# Patient Record
Sex: Female | Born: 1940 | Race: White | Hispanic: No | Smoking: Never smoker
Health system: Southern US, Community
[De-identification: ages and names within clinical notes are randomized; demographics above are authoritative.]

## PROBLEM LIST (undated history)

## (undated) DIAGNOSIS — M51369 Other intervertebral disc degeneration, lumbar region without mention of lumbar back pain or lower extremity pain: Secondary | ICD-10-CM

## (undated) DIAGNOSIS — K219 Gastro-esophageal reflux disease without esophagitis: Secondary | ICD-10-CM

## (undated) DIAGNOSIS — M5136 Other intervertebral disc degeneration, lumbar region: Secondary | ICD-10-CM

## (undated) DIAGNOSIS — H3530 Unspecified macular degeneration: Secondary | ICD-10-CM

## (undated) DIAGNOSIS — M810 Age-related osteoporosis without current pathological fracture: Secondary | ICD-10-CM

## (undated) DIAGNOSIS — M419 Scoliosis, unspecified: Secondary | ICD-10-CM

## (undated) DIAGNOSIS — A0472 Enterocolitis due to Clostridium difficile, not specified as recurrent: Secondary | ICD-10-CM

## (undated) HISTORY — DX: Gastro-esophageal reflux disease without esophagitis: K21.9

## (undated) HISTORY — PX: OTHER SURGICAL HISTORY: SHX169

## (undated) HISTORY — DX: Unspecified macular degeneration: H35.30

## (undated) HISTORY — DX: Scoliosis, unspecified: M41.9

## (undated) HISTORY — PX: SUPERFICIAL KERATECTOMY: SHX2459

## (undated) HISTORY — DX: Enterocolitis due to Clostridium difficile, not specified as recurrent: A04.72

## (undated) HISTORY — DX: Other intervertebral disc degeneration, lumbar region: M51.36

## (undated) HISTORY — DX: Other intervertebral disc degeneration, lumbar region without mention of lumbar back pain or lower extremity pain: M51.369

## (undated) HISTORY — PX: CATARACT EXTRACTION: SUR2

## (undated) HISTORY — PX: CERVICAL FUSION: SHX112

## (undated) HISTORY — DX: Age-related osteoporosis without current pathological fracture: M81.0

---

## 2001-01-21 ENCOUNTER — Ambulatory Visit (HOSPITAL_COMMUNITY): Admission: RE | Admit: 2001-01-21 | Discharge: 2001-01-21 | Payer: Self-pay | Admitting: Ophthalmology

## 2004-03-17 ENCOUNTER — Encounter: Admission: RE | Admit: 2004-03-17 | Discharge: 2004-03-17 | Payer: Self-pay | Admitting: Obstetrics and Gynecology

## 2004-04-05 ENCOUNTER — Encounter: Admission: RE | Admit: 2004-04-05 | Discharge: 2004-04-05 | Payer: Self-pay | Admitting: Obstetrics and Gynecology

## 2005-01-24 ENCOUNTER — Ambulatory Visit: Payer: Self-pay | Admitting: Internal Medicine

## 2005-02-01 ENCOUNTER — Ambulatory Visit: Payer: Self-pay | Admitting: Internal Medicine

## 2005-02-01 ENCOUNTER — Encounter: Payer: Self-pay | Admitting: Internal Medicine

## 2005-02-01 ENCOUNTER — Ambulatory Visit (HOSPITAL_COMMUNITY): Admission: RE | Admit: 2005-02-01 | Discharge: 2005-02-01 | Payer: Self-pay | Admitting: Internal Medicine

## 2005-03-09 ENCOUNTER — Ambulatory Visit: Payer: Self-pay | Admitting: Internal Medicine

## 2005-03-22 ENCOUNTER — Encounter: Admission: RE | Admit: 2005-03-22 | Discharge: 2005-03-22 | Payer: Self-pay | Admitting: Obstetrics and Gynecology

## 2006-03-27 ENCOUNTER — Encounter: Admission: RE | Admit: 2006-03-27 | Discharge: 2006-03-27 | Payer: Self-pay | Admitting: Internal Medicine

## 2007-04-01 ENCOUNTER — Ambulatory Visit (HOSPITAL_COMMUNITY): Admission: RE | Admit: 2007-04-01 | Discharge: 2007-04-01 | Payer: Self-pay | Admitting: Internal Medicine

## 2008-04-02 ENCOUNTER — Ambulatory Visit (HOSPITAL_COMMUNITY): Admission: RE | Admit: 2008-04-02 | Discharge: 2008-04-02 | Payer: Self-pay | Admitting: Internal Medicine

## 2009-04-04 ENCOUNTER — Ambulatory Visit (HOSPITAL_COMMUNITY): Admission: RE | Admit: 2009-04-04 | Discharge: 2009-04-04 | Payer: Self-pay | Admitting: Internal Medicine

## 2010-04-09 ENCOUNTER — Encounter: Payer: Self-pay | Admitting: *Deleted

## 2010-05-04 ENCOUNTER — Other Ambulatory Visit (HOSPITAL_COMMUNITY): Payer: Self-pay | Admitting: Internal Medicine

## 2010-05-04 DIAGNOSIS — Z139 Encounter for screening, unspecified: Secondary | ICD-10-CM

## 2010-05-11 ENCOUNTER — Ambulatory Visit (HOSPITAL_COMMUNITY)
Admission: RE | Admit: 2010-05-11 | Discharge: 2010-05-11 | Disposition: A | Discharge status: Home / Self Care | Payer: 59 | Source: Ambulatory Visit | Attending: Internal Medicine | Admitting: Internal Medicine

## 2010-05-11 DIAGNOSIS — Z1231 Encounter for screening mammogram for malignant neoplasm of breast: Secondary | ICD-10-CM | POA: Insufficient documentation

## 2010-05-11 DIAGNOSIS — Z139 Encounter for screening, unspecified: Secondary | ICD-10-CM

## 2010-08-04 NOTE — Op Note (Signed)
Sheila Oliver, Sheila Oliver               ACCOUNT NO.:  [id number]   MEDICAL RECORD NO.:  [id number]          PATIENT TYPE:  AMB   LOCATION:  DAY                           FACILITY:  [facility]   PHYSICIAN:  R. Roetta Sessions, M.D. DATE OF BIRTH:  1940-08-15   DATE OF PROCEDURE:  02/01/2005  DATE OF DISCHARGE:                                 OPERATIVE REPORT   PROCEDURE:  Esophagogastroduodenoscopy with Elease Hashimoto dilation followed by  esophageal biopsy.   INDICATIONS FOR PROCEDURE:  The patient is a 70 year old lady with a burning  mouth. She has some reflux symptoms ____________ esophageal dysphagia coming  for EGD. This approach has been discussed with the patient at length.  Potential risks, benefits, and alternatives have been reviewed. She had been  on Prevacid once daily and ran out of the medications four days ago. Notes  her above-mentioned symptoms have worsened. This approach has been discussed  with the patient at length. Potential risks, benefits, and alternatives have  been reviewed and questions answered. Please see the documentation in the  medical record.   PROCEDURE NOTE:  O2 saturation, blood pressure, pulse, and respirations were  monitored throughout the entire procedure. Conscious sedation with Versed 4  mg IV and Demerol 75 mg IV in divided doses. Cetacaine spray for topical  oropharyngeal anesthesia.   INSTRUMENT:  Olympus video chip system.   FINDINGS:  Examination of the tubular esophagus revealed a subtle Schatzki's  ring. Esophageal mucosa otherwise appeared entirely normal. EG junction  easily traversed.   Stomach:  Gastric cavity was empty and insufflated well with air. Thorough  examination of gastric mucosa including retroflexed view of the proximal  stomach and esophagogastric junction demonstrated no abnormalities. Pylorus  patent and easily traversed. Examination of bulb and second portion revealed  no abnormalities.   THERAPEUTIC/DIAGNOSTIC MANEUVERS:  A  54-French Maloney dilator was passed to  full insertion. A look back revealed no apparent complications related to  the passage of the dilator. Subsequently, esophageal biopsies were taken at  the mid esophagus level for histologic study. The patient tolerated the  procedure well and was reactive to endoscopy.   IMPRESSION:  1.  Normal esophagus aside from a subtle Schatzki's ring status post      dilation as described above, status post mid esophageal biopsies for      histology.  2.  Normal stomach. Normal D1 and D2.  3.  The patient may well have nonerosive reflux disease. Eosinophilic      esophagitis would be an outside possibility. 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   RECOMMENDATIONS:  1.  Resume and increase Prevacid to 30 mg orally twice daily, i.e. before      supper and breakfast. Follow up on pathology.  2.  Further recommendations to follow.      Jonathon Bellows, M.D.  Electronically Signed     RMR/MEDQ  D:  02/01/2005  T:  02/01/2005  Job:  [id number]   cc:   Sherryll Burger, M.D.  [facility]  [place], [place]

## 2011-05-21 ENCOUNTER — Other Ambulatory Visit (HOSPITAL_COMMUNITY): Payer: Self-pay | Admitting: Internal Medicine

## 2011-05-21 DIAGNOSIS — Z139 Encounter for screening, unspecified: Secondary | ICD-10-CM

## 2011-05-31 ENCOUNTER — Ambulatory Visit (HOSPITAL_COMMUNITY): Payer: 59

## 2011-06-07 ENCOUNTER — Ambulatory Visit (HOSPITAL_COMMUNITY)
Admission: RE | Admit: 2011-06-07 | Discharge: 2011-06-07 | Disposition: A | Discharge status: Home / Self Care | Payer: 59 | Source: Ambulatory Visit | Attending: Internal Medicine | Admitting: Internal Medicine

## 2011-06-07 DIAGNOSIS — Z1231 Encounter for screening mammogram for malignant neoplasm of breast: Secondary | ICD-10-CM | POA: Insufficient documentation

## 2011-06-07 DIAGNOSIS — Z139 Encounter for screening, unspecified: Secondary | ICD-10-CM

## 2011-11-28 ENCOUNTER — Other Ambulatory Visit: Payer: Self-pay | Admitting: Otolaryngology

## 2011-11-28 DIAGNOSIS — H903 Sensorineural hearing loss, bilateral: Secondary | ICD-10-CM

## 2011-11-29 ENCOUNTER — Other Ambulatory Visit: Payer: 59

## 2011-12-06 ENCOUNTER — Ambulatory Visit
Admission: RE | Admit: 2011-12-06 | Discharge: 2011-12-06 | Disposition: A | Discharge status: Home / Self Care | Payer: 59 | Source: Ambulatory Visit | Attending: Otolaryngology | Admitting: Otolaryngology

## 2011-12-06 DIAGNOSIS — H903 Sensorineural hearing loss, bilateral: Secondary | ICD-10-CM

## 2011-12-06 MED ORDER — GADOBENATE DIMEGLUMINE 529 MG/ML IV SOLN
12.0000 mL | Freq: Once | INTRAVENOUS | Status: AC | PRN
  Administered 2011-12-06: 12 mL via INTRAVENOUS

## 2012-06-13 ENCOUNTER — Other Ambulatory Visit (HOSPITAL_COMMUNITY): Payer: Self-pay | Admitting: Internal Medicine

## 2012-06-13 DIAGNOSIS — R52 Pain, unspecified: Secondary | ICD-10-CM

## 2012-06-18 ENCOUNTER — Ambulatory Visit (HOSPITAL_COMMUNITY)
Admission: RE | Admit: 2012-06-18 | Discharge: 2012-06-18 | Disposition: A | Discharge status: Home / Self Care | Payer: 59 | Source: Ambulatory Visit | Attending: Internal Medicine | Admitting: Internal Medicine

## 2012-06-18 ENCOUNTER — Other Ambulatory Visit (HOSPITAL_COMMUNITY): Payer: Self-pay | Admitting: Internal Medicine

## 2012-06-18 DIAGNOSIS — R52 Pain, unspecified: Secondary | ICD-10-CM

## 2012-06-18 DIAGNOSIS — Z1231 Encounter for screening mammogram for malignant neoplasm of breast: Secondary | ICD-10-CM | POA: Insufficient documentation

## 2013-08-31 ENCOUNTER — Other Ambulatory Visit (HOSPITAL_COMMUNITY): Payer: Self-pay | Admitting: Internal Medicine

## 2013-08-31 DIAGNOSIS — Z1231 Encounter for screening mammogram for malignant neoplasm of breast: Secondary | ICD-10-CM

## 2013-09-03 ENCOUNTER — Ambulatory Visit (HOSPITAL_COMMUNITY)
Admission: RE | Admit: 2013-09-03 | Discharge: 2013-09-03 | Disposition: A | Discharge status: Home / Self Care | Payer: Medicare Other | Source: Ambulatory Visit | Attending: Internal Medicine | Admitting: Internal Medicine

## 2013-09-03 DIAGNOSIS — Z1231 Encounter for screening mammogram for malignant neoplasm of breast: Secondary | ICD-10-CM | POA: Insufficient documentation

## 2014-07-05 ENCOUNTER — Other Ambulatory Visit (HOSPITAL_COMMUNITY): Payer: Self-pay | Admitting: Internal Medicine

## 2014-07-05 DIAGNOSIS — Z1231 Encounter for screening mammogram for malignant neoplasm of breast: Secondary | ICD-10-CM

## 2014-09-09 ENCOUNTER — Ambulatory Visit (HOSPITAL_COMMUNITY)
Admission: RE | Admit: 2014-09-09 | Discharge: 2014-09-09 | Disposition: A | Discharge status: Home / Self Care | Payer: Medicare Other | Source: Ambulatory Visit | Attending: Internal Medicine | Admitting: Internal Medicine

## 2014-09-09 ENCOUNTER — Ambulatory Visit (HOSPITAL_COMMUNITY): Payer: Medicare Other

## 2014-09-09 DIAGNOSIS — Z1231 Encounter for screening mammogram for malignant neoplasm of breast: Secondary | ICD-10-CM

## 2015-04-06 DIAGNOSIS — G43909 Migraine, unspecified, not intractable, without status migrainosus: Secondary | ICD-10-CM | POA: Diagnosis not present

## 2015-04-06 DIAGNOSIS — G8929 Other chronic pain: Secondary | ICD-10-CM | POA: Diagnosis not present

## 2015-04-06 DIAGNOSIS — M545 Low back pain: Secondary | ICD-10-CM | POA: Diagnosis not present

## 2015-04-06 DIAGNOSIS — M5136 Other intervertebral disc degeneration, lumbar region: Secondary | ICD-10-CM | POA: Diagnosis not present

## 2015-04-06 DIAGNOSIS — Z7951 Long term (current) use of inhaled steroids: Secondary | ICD-10-CM | POA: Diagnosis not present

## 2015-04-06 DIAGNOSIS — M419 Scoliosis, unspecified: Secondary | ICD-10-CM | POA: Diagnosis not present

## 2015-04-06 DIAGNOSIS — M47812 Spondylosis without myelopathy or radiculopathy, cervical region: Secondary | ICD-10-CM | POA: Diagnosis not present

## 2015-04-06 DIAGNOSIS — K219 Gastro-esophageal reflux disease without esophagitis: Secondary | ICD-10-CM | POA: Diagnosis not present

## 2015-04-06 DIAGNOSIS — B0229 Other postherpetic nervous system involvement: Secondary | ICD-10-CM | POA: Diagnosis not present

## 2015-04-06 DIAGNOSIS — M47816 Spondylosis without myelopathy or radiculopathy, lumbar region: Secondary | ICD-10-CM | POA: Diagnosis not present

## 2015-04-06 DIAGNOSIS — G5601 Carpal tunnel syndrome, right upper limb: Secondary | ICD-10-CM | POA: Diagnosis not present

## 2015-04-06 DIAGNOSIS — M8580 Other specified disorders of bone density and structure, unspecified site: Secondary | ICD-10-CM | POA: Diagnosis not present

## 2015-04-06 DIAGNOSIS — M542 Cervicalgia: Secondary | ICD-10-CM | POA: Diagnosis not present

## 2015-04-06 DIAGNOSIS — F419 Anxiety disorder, unspecified: Secondary | ICD-10-CM | POA: Diagnosis not present

## 2015-04-06 DIAGNOSIS — Z79899 Other long term (current) drug therapy: Secondary | ICD-10-CM | POA: Diagnosis not present

## 2015-05-04 DIAGNOSIS — M5136 Other intervertebral disc degeneration, lumbar region: Secondary | ICD-10-CM | POA: Diagnosis not present

## 2015-05-04 DIAGNOSIS — M47816 Spondylosis without myelopathy or radiculopathy, lumbar region: Secondary | ICD-10-CM | POA: Diagnosis not present

## 2015-05-05 DIAGNOSIS — Z79899 Other long term (current) drug therapy: Secondary | ICD-10-CM | POA: Diagnosis not present

## 2015-05-05 DIAGNOSIS — R5381 Other malaise: Secondary | ICD-10-CM | POA: Diagnosis not present

## 2015-05-05 DIAGNOSIS — R300 Dysuria: Secondary | ICD-10-CM | POA: Diagnosis not present

## 2015-05-05 DIAGNOSIS — M5030 Other cervical disc degeneration, unspecified cervical region: Secondary | ICD-10-CM | POA: Diagnosis not present

## 2015-05-05 DIAGNOSIS — R634 Abnormal weight loss: Secondary | ICD-10-CM | POA: Diagnosis not present

## 2015-05-05 DIAGNOSIS — M2550 Pain in unspecified joint: Secondary | ICD-10-CM | POA: Diagnosis not present

## 2015-05-05 DIAGNOSIS — M19041 Primary osteoarthritis, right hand: Secondary | ICD-10-CM | POA: Diagnosis not present

## 2015-05-05 DIAGNOSIS — M5137 Other intervertebral disc degeneration, lumbosacral region: Secondary | ICD-10-CM | POA: Diagnosis not present

## 2015-05-17 DIAGNOSIS — Z6821 Body mass index (BMI) 21.0-21.9, adult: Secondary | ICD-10-CM | POA: Diagnosis not present

## 2015-05-17 DIAGNOSIS — R634 Abnormal weight loss: Secondary | ICD-10-CM | POA: Diagnosis not present

## 2015-05-17 DIAGNOSIS — H9201 Otalgia, right ear: Secondary | ICD-10-CM | POA: Diagnosis not present

## 2015-05-17 DIAGNOSIS — Z789 Other specified health status: Secondary | ICD-10-CM | POA: Diagnosis not present

## 2015-08-16 DIAGNOSIS — J019 Acute sinusitis, unspecified: Secondary | ICD-10-CM | POA: Diagnosis not present

## 2015-08-16 DIAGNOSIS — E7800 Pure hypercholesterolemia, unspecified: Secondary | ICD-10-CM | POA: Diagnosis not present

## 2015-08-16 DIAGNOSIS — G4700 Insomnia, unspecified: Secondary | ICD-10-CM | POA: Diagnosis not present

## 2015-08-26 ENCOUNTER — Other Ambulatory Visit (HOSPITAL_COMMUNITY): Payer: Self-pay | Admitting: Internal Medicine

## 2015-08-26 DIAGNOSIS — Z1231 Encounter for screening mammogram for malignant neoplasm of breast: Secondary | ICD-10-CM

## 2015-09-12 ENCOUNTER — Ambulatory Visit (HOSPITAL_COMMUNITY)
Admission: RE | Admit: 2015-09-12 | Discharge: 2015-09-12 | Disposition: A | Discharge status: Home / Self Care | Payer: Medicare Other | Source: Ambulatory Visit | Attending: Internal Medicine | Admitting: Internal Medicine

## 2015-09-12 DIAGNOSIS — Z1231 Encounter for screening mammogram for malignant neoplasm of breast: Secondary | ICD-10-CM

## 2015-11-03 DIAGNOSIS — Z79899 Other long term (current) drug therapy: Secondary | ICD-10-CM | POA: Diagnosis not present

## 2015-11-03 DIAGNOSIS — R300 Dysuria: Secondary | ICD-10-CM | POA: Diagnosis not present

## 2015-11-03 DIAGNOSIS — M810 Age-related osteoporosis without current pathological fracture: Secondary | ICD-10-CM | POA: Diagnosis not present

## 2015-11-03 DIAGNOSIS — M19241 Secondary osteoarthritis, right hand: Secondary | ICD-10-CM | POA: Diagnosis not present

## 2015-11-03 DIAGNOSIS — M5137 Other intervertebral disc degeneration, lumbosacral region: Secondary | ICD-10-CM | POA: Diagnosis not present

## 2015-11-03 DIAGNOSIS — M2550 Pain in unspecified joint: Secondary | ICD-10-CM | POA: Diagnosis not present

## 2015-11-08 DIAGNOSIS — Z0000 Encounter for general adult medical examination without abnormal findings: Secondary | ICD-10-CM | POA: Diagnosis not present

## 2015-11-08 DIAGNOSIS — Z1211 Encounter for screening for malignant neoplasm of colon: Secondary | ICD-10-CM | POA: Diagnosis not present

## 2015-11-08 DIAGNOSIS — Z1389 Encounter for screening for other disorder: Secondary | ICD-10-CM | POA: Diagnosis not present

## 2015-11-08 DIAGNOSIS — Z299 Encounter for prophylactic measures, unspecified: Secondary | ICD-10-CM | POA: Diagnosis not present

## 2015-11-08 DIAGNOSIS — Z7189 Other specified counseling: Secondary | ICD-10-CM | POA: Diagnosis not present

## 2015-11-09 DIAGNOSIS — E7800 Pure hypercholesterolemia, unspecified: Secondary | ICD-10-CM | POA: Diagnosis not present

## 2015-11-09 DIAGNOSIS — E559 Vitamin D deficiency, unspecified: Secondary | ICD-10-CM | POA: Diagnosis not present

## 2015-11-09 DIAGNOSIS — R5383 Other fatigue: Secondary | ICD-10-CM | POA: Diagnosis not present

## 2015-12-22 DIAGNOSIS — E2839 Other primary ovarian failure: Secondary | ICD-10-CM | POA: Diagnosis not present

## 2016-01-10 DIAGNOSIS — Z6821 Body mass index (BMI) 21.0-21.9, adult: Secondary | ICD-10-CM | POA: Diagnosis not present

## 2016-01-10 DIAGNOSIS — R1310 Dysphagia, unspecified: Secondary | ICD-10-CM | POA: Diagnosis not present

## 2016-01-10 DIAGNOSIS — G4700 Insomnia, unspecified: Secondary | ICD-10-CM | POA: Diagnosis not present

## 2016-01-10 DIAGNOSIS — M8580 Other specified disorders of bone density and structure, unspecified site: Secondary | ICD-10-CM | POA: Diagnosis not present

## 2016-01-10 DIAGNOSIS — R05 Cough: Secondary | ICD-10-CM | POA: Diagnosis not present

## 2016-01-10 DIAGNOSIS — Z299 Encounter for prophylactic measures, unspecified: Secondary | ICD-10-CM | POA: Diagnosis not present

## 2016-01-11 DIAGNOSIS — G8929 Other chronic pain: Secondary | ICD-10-CM | POA: Diagnosis not present

## 2016-01-11 DIAGNOSIS — R079 Chest pain, unspecified: Secondary | ICD-10-CM | POA: Diagnosis not present

## 2016-01-11 DIAGNOSIS — R05 Cough: Secondary | ICD-10-CM | POA: Diagnosis not present

## 2016-02-15 DIAGNOSIS — G8929 Other chronic pain: Secondary | ICD-10-CM | POA: Diagnosis not present

## 2016-02-15 DIAGNOSIS — M4716 Other spondylosis with myelopathy, lumbar region: Secondary | ICD-10-CM | POA: Diagnosis not present

## 2016-02-15 DIAGNOSIS — Z79899 Other long term (current) drug therapy: Secondary | ICD-10-CM | POA: Diagnosis not present

## 2016-03-26 DIAGNOSIS — Z9842 Cataract extraction status, left eye: Secondary | ICD-10-CM | POA: Diagnosis not present

## 2016-03-26 DIAGNOSIS — Z9889 Other specified postprocedural states: Secondary | ICD-10-CM | POA: Diagnosis not present

## 2016-03-26 DIAGNOSIS — H35361 Drusen (degenerative) of macula, right eye: Secondary | ICD-10-CM | POA: Diagnosis not present

## 2016-03-26 DIAGNOSIS — Z961 Presence of intraocular lens: Secondary | ICD-10-CM | POA: Diagnosis not present

## 2016-03-26 DIAGNOSIS — H25811 Combined forms of age-related cataract, right eye: Secondary | ICD-10-CM | POA: Diagnosis not present

## 2016-03-26 DIAGNOSIS — H3530 Unspecified macular degeneration: Secondary | ICD-10-CM | POA: Diagnosis not present

## 2016-03-26 DIAGNOSIS — Z9849 Cataract extraction status, unspecified eye: Secondary | ICD-10-CM | POA: Diagnosis not present

## 2016-03-26 DIAGNOSIS — H35363 Drusen (degenerative) of macula, bilateral: Secondary | ICD-10-CM | POA: Diagnosis not present

## 2016-04-02 DIAGNOSIS — M47817 Spondylosis without myelopathy or radiculopathy, lumbosacral region: Secondary | ICD-10-CM | POA: Diagnosis not present

## 2016-04-02 DIAGNOSIS — M5136 Other intervertebral disc degeneration, lumbar region: Secondary | ICD-10-CM | POA: Diagnosis not present

## 2016-04-10 DIAGNOSIS — M47817 Spondylosis without myelopathy or radiculopathy, lumbosacral region: Secondary | ICD-10-CM | POA: Diagnosis not present

## 2016-04-10 DIAGNOSIS — Z79899 Other long term (current) drug therapy: Secondary | ICD-10-CM | POA: Diagnosis not present

## 2016-04-10 DIAGNOSIS — M5136 Other intervertebral disc degeneration, lumbar region: Secondary | ICD-10-CM | POA: Diagnosis not present

## 2016-04-10 DIAGNOSIS — M4716 Other spondylosis with myelopathy, lumbar region: Secondary | ICD-10-CM | POA: Diagnosis not present

## 2016-04-10 DIAGNOSIS — G8929 Other chronic pain: Secondary | ICD-10-CM | POA: Diagnosis not present

## 2016-04-12 DIAGNOSIS — H179 Unspecified corneal scar and opacity: Secondary | ICD-10-CM | POA: Diagnosis not present

## 2016-04-12 DIAGNOSIS — H18451 Nodular corneal degeneration, right eye: Secondary | ICD-10-CM | POA: Diagnosis not present

## 2016-04-17 DIAGNOSIS — M545 Low back pain: Secondary | ICD-10-CM | POA: Diagnosis not present

## 2016-04-17 DIAGNOSIS — Z6821 Body mass index (BMI) 21.0-21.9, adult: Secondary | ICD-10-CM | POA: Diagnosis not present

## 2016-04-17 DIAGNOSIS — K219 Gastro-esophageal reflux disease without esophagitis: Secondary | ICD-10-CM | POA: Diagnosis not present

## 2016-04-17 DIAGNOSIS — Z789 Other specified health status: Secondary | ICD-10-CM | POA: Diagnosis not present

## 2016-04-17 DIAGNOSIS — Z713 Dietary counseling and surveillance: Secondary | ICD-10-CM | POA: Diagnosis not present

## 2016-04-17 DIAGNOSIS — K5900 Constipation, unspecified: Secondary | ICD-10-CM | POA: Diagnosis not present

## 2016-04-17 DIAGNOSIS — Z299 Encounter for prophylactic measures, unspecified: Secondary | ICD-10-CM | POA: Diagnosis not present

## 2016-04-17 DIAGNOSIS — M329 Systemic lupus erythematosus, unspecified: Secondary | ICD-10-CM | POA: Diagnosis not present

## 2016-04-17 DIAGNOSIS — J019 Acute sinusitis, unspecified: Secondary | ICD-10-CM | POA: Diagnosis not present

## 2016-04-18 DIAGNOSIS — Z9889 Other specified postprocedural states: Secondary | ICD-10-CM | POA: Diagnosis not present

## 2016-04-18 DIAGNOSIS — H25811 Combined forms of age-related cataract, right eye: Secondary | ICD-10-CM | POA: Diagnosis not present

## 2016-04-18 DIAGNOSIS — Z961 Presence of intraocular lens: Secondary | ICD-10-CM | POA: Diagnosis not present

## 2016-04-18 DIAGNOSIS — Z48810 Encounter for surgical aftercare following surgery on the sense organs: Secondary | ICD-10-CM | POA: Diagnosis not present

## 2016-04-18 DIAGNOSIS — Z9842 Cataract extraction status, left eye: Secondary | ICD-10-CM | POA: Diagnosis not present

## 2016-04-23 ENCOUNTER — Encounter: Payer: Self-pay | Admitting: Internal Medicine

## 2016-04-23 DIAGNOSIS — M545 Low back pain: Secondary | ICD-10-CM | POA: Diagnosis not present

## 2016-04-23 DIAGNOSIS — M4684 Other specified inflammatory spondylopathies, thoracic region: Secondary | ICD-10-CM | POA: Diagnosis not present

## 2016-04-23 DIAGNOSIS — M48061 Spinal stenosis, lumbar region without neurogenic claudication: Secondary | ICD-10-CM | POA: Diagnosis not present

## 2016-04-23 DIAGNOSIS — M5126 Other intervertebral disc displacement, lumbar region: Secondary | ICD-10-CM | POA: Diagnosis not present

## 2016-04-23 DIAGNOSIS — M9983 Other biomechanical lesions of lumbar region: Secondary | ICD-10-CM | POA: Diagnosis not present

## 2016-04-23 DIAGNOSIS — Q7649 Other congenital malformations of spine, not associated with scoliosis: Secondary | ICD-10-CM | POA: Diagnosis not present

## 2016-04-23 DIAGNOSIS — M5136 Other intervertebral disc degeneration, lumbar region: Secondary | ICD-10-CM | POA: Diagnosis not present

## 2016-04-27 ENCOUNTER — Ambulatory Visit: Payer: Medicare Other | Admitting: Internal Medicine

## 2016-05-01 ENCOUNTER — Ambulatory Visit (INDEPENDENT_AMBULATORY_CARE_PROVIDER_SITE_OTHER): Payer: Medicare Other | Admitting: Internal Medicine

## 2016-05-01 ENCOUNTER — Other Ambulatory Visit: Payer: Self-pay

## 2016-05-01 ENCOUNTER — Encounter: Payer: Self-pay | Admitting: Internal Medicine

## 2016-05-01 VITALS — BP 139/79 | HR 79 | Temp 97.7°F | Ht 65.0 in | Wt 120.6 lb

## 2016-05-01 DIAGNOSIS — R197 Diarrhea, unspecified: Secondary | ICD-10-CM

## 2016-05-01 DIAGNOSIS — R1310 Dysphagia, unspecified: Secondary | ICD-10-CM | POA: Diagnosis not present

## 2016-05-01 DIAGNOSIS — K219 Gastro-esophageal reflux disease without esophagitis: Secondary | ICD-10-CM

## 2016-05-01 DIAGNOSIS — K529 Noninfective gastroenteritis and colitis, unspecified: Secondary | ICD-10-CM

## 2016-05-01 MED ORDER — PEG 3350-KCL-NA BICARB-NACL 420 G PO SOLR: 4000.0000 mL | ORAL | 0 refills | Status: DC

## 2016-05-01 NOTE — Patient Instructions (Signed)
Schedule EGD with ED and diagnostic colonoscopy - GERD, dysphagia and chronic diarrhea  Propofol  Further recommendations to follow

## 2016-05-01 NOTE — Progress Notes (Signed)
  Primary Care Physician:  SHAH,ASHISH, MD Primary Gastroenterologist:  Dr. Rourk  Pre-Procedure History & Physical: HPI:  Sheila Oliver (AKA Sheila Oliver) is a 75 y.o. female here for further evaluation of chronic diarrhea. Has multiple loose stools daily -nonbloody. It's been going on for years. She has declined a colonoscopy previously. No family history of inflammatory bowel disease or colon cancer. She does make the observation which she takes a boost beverage it constipates her for about a day. She has occasional fecal incontinence.  Long-standing GERD. Was on Prilosec 20 mg daily-Read an article relating to kidney damage and Prilosec. Stopped taking his medications. Now with recurrent reflux symptoms. Also, notes recurrent esophageal dysphagia for years. She reminded me, that back in 2006, she underwent EGD by me which revealed a subtle Schatzki's ring. It was dilated with a 54 French Maloney. This will resulted in resolution of her dysphagia until the past couple of years.  Patient states she's lost 15 pounds over the past year - unintentionally.  History of scoliosis. Sees Dr. Deborah Shire, rheumatologist in [place]. Has a positive lupus antibody.  No past medical history on file.  No past surgical history on file.  Prior to Admission medications   Medication Sig Start Date End Date Taking? Authorizing Provider  aspirin EC 81 MG tablet Take 81 mg by mouth daily.   Yes Historical Provider, MD  CALCIUM-VITAMIN D PO Take 1 tablet by mouth 2 (two) times daily. Calcium 600mg + Vitamin D 800   Yes Historical Provider, MD  diclofenac sodium (VOLTAREN) 1 % GEL Apply topically as needed.   Yes Historical Provider, MD  LORazepam (ATIVAN) 0.5 MG tablet Take 0.5 mg by mouth daily.   Yes Historical Provider, MD  traMADol (ULTRAM) 50 MG tablet Take 50 mg by mouth as needed.   Yes Historical Provider, MD  traZODone (DESYREL) 50 MG tablet Take 50 mg by mouth at bedtime.   Yes Historical  Provider, MD  polyethylene glycol-electrolytes (TRILYTE) 420 g solution Take 4,000 mLs by mouth as directed. 05/01/16   Robert M Rourk, MD    Allergies as of 05/01/2016 - Review Complete 05/01/2016  Allergen Reaction Noted  . Cortisone  05/01/2016    No family history on file.  Social History   Social History  . Marital status: Widowed    Spouse name: N/A  . Number of children: N/A  . Years of education: N/A   Occupational History  . Not on file.   Social History Main Topics  . Smoking status: Never Smoker  . Smokeless tobacco: Never Used  . Alcohol use No  . Drug use: No  . Sexual activity: Not on file   Other Topics Concern  . Not on file   Social History Narrative  . No narrative on file    Review of Systems: See HPI, otherwise negative ROS  Physical Exam: BP 139/79   Pulse 79   Temp 97.7 F (36.5 C) (Oral)   Ht 5' 5" (1.651 m)   Wt 120 lb 9.6 oz (54.7 kg)   BMI 20.07 kg/m  General:   Alert,   pleasant and cooperative in NAD Neck:  Supple; no masses or thyromegaly. No significant cervical adenopathy. Lungs:  Clear throughout to auscultation.   No wheezes, crackles, or rhonchi. No acute distress. Heart:  Regular rate and rhythm; no murmurs, clicks, rubs,  or gallops. Abdomen: Non-distended, normal bowel sounds.  Soft and nontender without appreciable mass or hepatosplenomegaly.  Pulses:    Normal pulses noted. Extremities:  Without clubbing or edema.  Impression:  Pleasant 75-year-old lady with chronic diarrhea. No prior colonoscopy. Doubt an infectious agent this point in time. Also doubt occult inflammatory bowel disease but that would remain in the differential. Needs a colonoscopy for colorectal cancer screening and to further evaluate for the possibility of microscopic colitis.  Chronic GERD with recurrent esophageal dysphasia setting of a known Schatzki's ring. This too needs further evaluation. Weight loss subtle but is noted.  Patient needs EGD with  repeat dilation as feasible/appropriate.  Her Epic medical records need to be merged.  Recommendations:  I have offered the patient both an EGD with esophageal dilation as feasible/appropriate along with a diagnostic ileo-colonoscopy in the near future. Will utilize propofol. The risks, benefits, limitations, imponderables and alternatives regarding both EGD and colonoscopy have been reviewed with the patient. Questions have been answered. All parties agreeable.      Notice: This dictation was prepared with Dragon dictation along with smaller phrase technology. Any transcriptional errors that result from this process are unintentional and may not be corrected upon review. 

## 2016-05-07 DIAGNOSIS — R768 Other specified abnormal immunological findings in serum: Secondary | ICD-10-CM | POA: Insufficient documentation

## 2016-05-07 DIAGNOSIS — M19042 Primary osteoarthritis, left hand: Secondary | ICD-10-CM

## 2016-05-07 DIAGNOSIS — M19041 Primary osteoarthritis, right hand: Secondary | ICD-10-CM | POA: Insufficient documentation

## 2016-05-07 DIAGNOSIS — M47816 Spondylosis without myelopathy or radiculopathy, lumbar region: Secondary | ICD-10-CM | POA: Insufficient documentation

## 2016-05-07 DIAGNOSIS — M47812 Spondylosis without myelopathy or radiculopathy, cervical region: Secondary | ICD-10-CM | POA: Insufficient documentation

## 2016-05-07 NOTE — Progress Notes (Signed)
Office Visit Note  Patient: Sheila Oliver             Date of Birth: 08-07-1940           MRN: [id number]             PCP: Monico Blitz, MD Referring: Monico Blitz, MD Visit Date: 05/15/2016 Occupation: @GUAROCC @    Subjective: hand pain   History of Present Illness: Sheila Oliver is a 76 y.o. female with history of for him osteoarthritis and disc disease. According to patient she's been having chronic diarrhea for at least for 6 months. She decided to eliminate her supplements and medications in January 2018. She states her diarrhea has improved. She is scheduled to have endoscopy and colonoscopy sometimes in March. She recalls that in January she had epidural injection for the lower back pain which relieved some of the lower back symptoms although the injection itself is painful for her. She also had MRI of her lumbar spine in February for which the results are pending at this point. She states she had eye surgery on her right eye in January was successful.  Activities of Daily Living:  Patient reports morning stiffness for 0 minutes.   Patient Denies nocturnal pain.  Difficulty dressing/grooming: Denies Difficulty climbing stairs: Denies Difficulty getting out of chair: Denies Difficulty using hands for taps, buttons, cutlery, and/or writing: Denies   Review of Systems  Constitutional: Positive for fatigue. Negative for night sweats, weight gain, weight loss and weakness.  HENT: Negative for mouth sores, trouble swallowing, trouble swallowing, mouth dryness and nose dryness.   Eyes: Negative for pain, redness, visual disturbance and dryness.  Respiratory: Negative for cough, shortness of breath and difficulty breathing.   Cardiovascular: Negative for chest pain, palpitations, hypertension, irregular heartbeat and swelling in legs/feet.  Gastrointestinal: Positive for diarrhea. Negative for blood in stool and constipation.  Endocrine: Negative for increased urination.    Genitourinary: Negative for vaginal dryness.  Musculoskeletal: Positive for arthralgias, joint pain and morning stiffness. Negative for joint swelling, myalgias, muscle weakness, muscle tenderness and myalgias.  Skin: Negative for color change, rash, hair loss, skin tightness, ulcers and sensitivity to sunlight.  Allergic/Immunologic: Negative for susceptible to infections.  Neurological: Negative for dizziness, memory loss and night sweats.  Hematological: Negative for swollen glands.  Psychiatric/Behavioral: Positive for sleep disturbance. Negative for depressed mood. The patient is not nervous/anxious.     PMFS History:  Patient Active Problem List   Diagnosis Date Noted  . Osteopenia of multiple sites 05/15/2016  . Chronic pain syndrome 05/15/2016  . Primary insomnia 05/15/2016  . History of gastroesophageal reflux (GERD) 05/15/2016  . ANA positive 05/07/2016  . DJD (degenerative joint disease), cervical 05/07/2016  . Spondylosis of lumbar region without myelopathy or radiculopathy 05/07/2016  . Primary osteoarthritis of both hands 05/07/2016    Past Medical History:  Diagnosis Date  . DDD (degenerative disc disease), lumbar   . GERD (gastroesophageal reflux disease)   . Osteoporosis     No family history on file. No past surgical history on file. Social History   Social History Narrative  . No narrative on file     Objective: Vital Signs: BP 118/70   Pulse 68   Resp 14   Ht 5\' 5"  (1.651 m)   Wt 122 lb (55.3 kg)   BMI 20.30 kg/m    Physical Exam  Constitutional: She is oriented to person, place, and time. She appears well-developed and well-nourished.  HENT:  Head:  Normocephalic and atraumatic.  Eyes: Conjunctivae and EOM are normal.  Neck: Normal range of motion.  Cardiovascular: Normal rate, regular rhythm, normal heart sounds and intact distal pulses.   Pulmonary/Chest: Effort normal and breath sounds normal.  Abdominal: Soft. Bowel sounds are normal.   Lymphadenopathy:    She has no cervical adenopathy.  Neurological: She is alert and oriented to person, place, and time.  Skin: Skin is warm and dry. Capillary refill takes less than 2 seconds.  Psychiatric: She has a normal mood and affect. Her behavior is normal.  Nursing note and vitals reviewed.    Musculoskeletal Exam: C-spine limited range of motion lumbar spine limited range of motion with the scoliosis. Shoulder joints elbow joints wrist joints good range of motion. She is thickening of PIP/DIP joints and CMC joints consistent with osteoarthritis no synovitis was noted. Hip joints knee joints ankles MTPs PIPs with good range of motion with no synovitis she is some thickening of PIP joints in her feet consistent with osteoarthritis.  CDAI Exam: No CDAI exam completed.    Investigation: Findings:  Labs from May 05, 2015, show CMP with GFR normal except for elevated creatinine at 1.  Decreased AST at 8, which is within normal limits.  GFR is 56.  CBC with diff is normal.  Sed rate is normal.  TSH is normal.  ANA is negative.  Urinalysis is negative.  Double-stranded DNA is positive at 17.  Labs from January 2015:  Beta-2 IgA was elevated at 30 and double strand DNA was positive at 15, which was low titer.  The rest of the ENA panel was negative.  Her complements were normal.  Her CBC, comprehensive metabolic panel, Q000111Q, vitamin D, uric acid, TSH, C3, C4 and UA were all within normal limits.   November 2014:  Rheumatoid factor and CCP were negative.  ANA was positive.  No titer was given and sed rate was 2.     04/16/2013 We also obtained x-rays of the lumbar spine, 2 views, which show severe scoliosis, severe disk disease with L3-4 listhesis.  Bilateral hand x-rays, AP and oblique, show bilateral CMC arthritis, PIP and DIP narrowing which is severe.  No MCP changes consistent with osteoarthritis.  X-rays of the C-spine show multilevel spondylosis with anterior spurring consistent  with disk disease.     History of positive ANA, double-strand DNA and beta-2 antibodies in the past, but she has no clinical features of autoimmune disease.     Imaging: No results found.  Speciality Comments: No specialty comments available.    Procedures:  No procedures performed Allergies: Cortisone   Assessment / Plan:     Visit Diagnoses: ANA positive - Positive ANA, positive double-stranded DNA, positive beta-2, no clinical features of autoimmune disease. She has no synovitis no clinical features of autoimmune disease on examination today.  DJD (degenerative joint disease), cervical - Status post discectomy . She does not have much discomfort currently.  Spondylosis of lumbar region without myelopathy or radiculopathy - With the scoliosis. Her lower back pain is improved after epidural.  Primary osteoarthritis of both hands - Severe. Joint protection and muscle strengthening was discussed.  History of diarrhea: Improved after stopping all the medications and supplements. She is endoscopy and colonoscopy scheduled.  Osteopenia of multiple sites: Use of calcium and vitamin D and resistive exercises was discussed.  Her other medical problems are listed as follows:  Chronic pain syndrome  Primary insomnia  History of gastroesophageal reflux (GERD)  Orders: No orders of the defined types were placed in this encounter.  No orders of the defined types were placed in this encounter.   Face-to-face time spent with patient was 30 minutes. 50% of time was spent in counseling and coordination of care.  Follow-Up Instructions: Return in about 8 months (around 01/12/2017) for Osteoarthritis.   Bo Merino, MD  Note - This record has been created using Editor, commissioning.  Chart creation errors have been sought, but may not always  have been located. Such creation errors do not reflect on  the standard of medical care.

## 2016-05-15 ENCOUNTER — Encounter: Payer: Self-pay | Admitting: Rheumatology

## 2016-05-15 ENCOUNTER — Ambulatory Visit (INDEPENDENT_AMBULATORY_CARE_PROVIDER_SITE_OTHER): Payer: Medicare Other | Admitting: Rheumatology

## 2016-05-15 VITALS — BP 118/70 | HR 68 | Resp 14 | Ht 65.0 in | Wt 122.0 lb

## 2016-05-15 DIAGNOSIS — R768 Other specified abnormal immunological findings in serum: Secondary | ICD-10-CM | POA: Diagnosis not present

## 2016-05-15 DIAGNOSIS — M5030 Other cervical disc degeneration, unspecified cervical region: Secondary | ICD-10-CM | POA: Diagnosis not present

## 2016-05-15 DIAGNOSIS — M47816 Spondylosis without myelopathy or radiculopathy, lumbar region: Secondary | ICD-10-CM

## 2016-05-15 DIAGNOSIS — M19042 Primary osteoarthritis, left hand: Secondary | ICD-10-CM

## 2016-05-15 DIAGNOSIS — M8589 Other specified disorders of bone density and structure, multiple sites: Secondary | ICD-10-CM | POA: Insufficient documentation

## 2016-05-15 DIAGNOSIS — Z8719 Personal history of other diseases of the digestive system: Secondary | ICD-10-CM

## 2016-05-15 DIAGNOSIS — M19041 Primary osteoarthritis, right hand: Secondary | ICD-10-CM

## 2016-05-15 DIAGNOSIS — F5101 Primary insomnia: Secondary | ICD-10-CM | POA: Diagnosis not present

## 2016-05-15 DIAGNOSIS — G894 Chronic pain syndrome: Secondary | ICD-10-CM | POA: Insufficient documentation

## 2016-05-15 DIAGNOSIS — M47812 Spondylosis without myelopathy or radiculopathy, cervical region: Secondary | ICD-10-CM

## 2016-05-22 NOTE — Patient Instructions (Signed)
Sheila Oliver  05/22/2016     @PREFPERIOPPHARMACY @   Your procedure is scheduled on   05/31/2016   Report to [facility] at  [place].M.  Call this number if you have problems the morning of surgery:  [phone]   Remember:  Do not eat food or drink liquids after midnight.  Take these medicines the morning of surgery with A SIP OF WATER  Ultram.   Do not wear jewelry, make-up or nail polish.  Do not wear lotions, powders, or perfumes, or deoderant.  Do not shave 48 hours prior to surgery.  Men may shave face and neck.  Do not bring valuables to the hospital.  [facility] is not responsible for any belongings or valuables.  Contacts, dentures or bridgework may not be worn into surgery.  Leave your suitcase in the car.  After surgery it may be brought to your room.  For patients admitted to the hospital, discharge time will be determined by your treatment team.  Patients discharged the day of surgery will not be allowed to drive home.   Name and phone number of your driver:   family Special instructions:  Follow the diet and prep instructions given to you by Dr Roseanne Kaufman office.  Please read over the following fact sheets that you were given. Anesthesia Post-op Instructions and Care and Recovery After Surgery       Esophageal Dilatation Esophageal dilatation is a procedure to open a blocked or narrowed part of the esophagus. The esophagus is the long tube in your throat that carries food and liquid from your mouth to your stomach. The procedure is also called esophageal dilation. You may need this procedure if you have a buildup of scar tissue in your esophagus that makes it difficult, painful, or even impossible to swallow. This can be caused by gastroesophageal reflux disease (GERD). In rare cases, people need this procedure because they have cancer of the esophagus or a problem with the way food moves through the esophagus. Sometimes you may need to have  another dilatation to enlarge the opening of the esophagus gradually. Tell a health care provider about:  Any allergies you have.  All medicines you are taking, including vitamins, herbs, eye drops, creams, and over-the-counter medicines.  Any problems you or family members have had with anesthetic medicines.  Any blood disorders you have.  Any surgeries you have had.  Any medical conditions you have.  Any antibiotic medicines you are required to take before dental procedures. What are the risks? Generally, this is a safe procedure. However, problems can occur and include:  Bleeding from a tear in the lining of the esophagus.  A hole (perforation) in the esophagus. What happens before the procedure?  Do not eat or drink anything after midnight on the night before the procedure or as directed by your health care provider.  Ask your health care provider about changing or stopping your regular medicines. This is especially important if you are taking diabetes medicines or blood thinners.  Plan to have someone take you home after the procedure. What happens during the procedure?  You will be given a medicine that makes you relaxed and sleepy (sedative).  A medicine may be sprayed or gargled to numb the back of the throat.  Your health care provider can use various instruments to do an esophageal dilatation. During the procedure, the instrument used will be placed in your  mouth and passed down into your esophagus. Options include:  Simple dilators. This instrument is carefully placed in the esophagus to stretch it.  Guided wire bougies. In this method, a flexible tube (endoscope) is used to insert a wire into the esophagus. The dilator is passed over this wire to enlarge the esophagus. Then the wire is removed.  Balloon dilators. An endoscope with a small balloon at the end is passed down into the esophagus. Inflating the balloon gently stretches the esophagus and opens it  up. What happens after the procedure?  Your blood pressure, heart rate, breathing rate, and blood oxygen level will be monitored often until the medicines you were given have worn off.  Your throat may feel slightly sore and will probably still feel numb. This will improve slowly over time.  You will not be allowed to eat or drink until the throat numbness has resolved.  If this is a same-day procedure, you may be allowed to go home once you have been able to drink, urinate, and sit on the edge of the bed without nausea or dizziness.  If this is a same-day procedure, you should have a friend or family member with you for the next 24 hours after the procedure. This information is not intended to replace advice given to you by your health care provider. Make sure you discuss any questions you have with your health care provider. Document Released: 04/26/2005 Document Revised: 08/11/2015 Document Reviewed: 07/15/2013 Elsevier Interactive Patient Education  2017 [place]. Esophagogastroduodenoscopy, Care After Refer to this sheet in the next few weeks. These instructions provide you with information about caring for yourself after your procedure. Your health care provider may also give you more specific instructions. Your treatment has been planned according to current medical practices, but problems sometimes occur. Call your health care provider if you have any problems or questions after your procedure. What can I expect after the procedure? After the procedure, it is common to have:  A sore throat.  Nausea.  Bloating.  Dizziness.  Fatigue. Follow these instructions at home:  Do not eat or drink anything until the numbing medicine (local anesthetic) has worn off and your gag reflex has returned. You will know that the local anesthetic has worn off when you can swallow comfortably.  Do not drive for 24 hours if you received a medicine to help you relax (sedative).  If your  health care provider took a tissue sample for testing during the procedure, make sure to get your test results. This is your responsibility. Ask your health care provider or the department performing the test when your results will be ready.  Keep all follow-up visits as told by your health care provider. This is important. Contact a health care provider if:  You cannot stop coughing.  You are not urinating.  You are urinating less than usual. Get help right away if:  You have trouble swallowing.  You cannot eat or drink.  You have throat or chest pain that gets worse.  You are dizzy or light-headed.  You faint.  You have nausea or vomiting.  You have chills.  You have a fever.  You have severe abdominal pain.  You have black, tarry, or bloody stools. This information is not intended to replace advice given to you by your health care provider. Make sure you discuss any questions you have with your health care provider. Document Released: 02/20/2012 Document Revised: 08/11/2015 Document Reviewed: 01/27/2015 Elsevier Interactive Patient Education  2017  [place].  Colonoscopy, Adult A colonoscopy is an exam to look at the entire large intestine. During the exam, a lubricated, bendable tube is inserted into the anus and then passed into the rectum, colon, and other parts of the large intestine. A colonoscopy is often done as a part of normal colorectal screening or in response to certain symptoms, such as anemia, persistent diarrhea, abdominal pain, and blood in the stool. The exam can help screen for and diagnose medical problems, including:  Tumors.  Polyps.  Inflammation.  Areas of bleeding. Tell a health care provider about:  Any allergies you have.  All medicines you are taking, including vitamins, herbs, eye drops, creams, and over-the-counter medicines.  Any problems you or family members have had with anesthetic medicines.  Any blood disorders you  have.  Any surgeries you have had.  Any medical conditions you have.  Any problems you have had passing stool. What are the risks? Generally, this is a safe procedure. However, problems may occur, including:  Bleeding.  A tear in the intestine.  A reaction to medicines given during the exam.  Infection (rare). What happens before the procedure? Eating and drinking restrictions  Follow instructions from your health care provider about eating and drinking, which may include:  A few days before the procedure - follow a low-fiber diet. Avoid nuts, seeds, dried fruit, raw fruits, and vegetables.  1-3 days before the procedure - follow a clear liquid diet. Drink only clear liquids, such as clear broth or bouillon, black coffee or tea, clear juice, clear soft drinks or sports drinks, gelatin dessert, and popsicles. Avoid any liquids that contain red or purple dye.  On the day of the procedure - do not eat or drink anything during the 2 hours before the procedure, or within the time period that your health care provider recommends. Bowel prep  If you were prescribed an oral bowel prep to clean out your colon:  Take it as told by your health care provider. Starting the day before your procedure, you will need to drink a large amount of medicated liquid. The liquid will cause you to have multiple loose stools until your stool is almost clear or light green.  If your skin or anus gets irritated from diarrhea, you may use these to relieve the irritation:  Medicated wipes, such as adult wet wipes with aloe and vitamin E.  A skin soothing-product like petroleum jelly.  If you vomit while drinking the bowel prep, take a break for up to 60 minutes and then begin the bowel prep again. If vomiting continues and you cannot take the bowel prep without vomiting, call your health care provider. General instructions   Ask your health care provider about changing or stopping your regular medicines.  This is especially important if you are taking diabetes medicines or blood thinners.  Plan to have someone take you home from the hospital or clinic. What happens during the procedure?  An IV tube may be inserted into one of your veins.  You will be given medicine to help you relax (sedative).  To reduce your risk of infection:  Your health care team will wash or sanitize their hands.  Your anal area will be washed with soap.  You will be asked to lie on your side with your knees bent.  Your health care provider will lubricate a long, thin, flexible tube. The tube will have a camera and a light on the end.  The tube will be  inserted into your anus.  The tube will be gently eased through your rectum and colon.  Air will be delivered into your colon to keep it open. You may feel some pressure or cramping.  The camera will be used to take images during the procedure.  A small tissue sample may be removed from your body to be examined under a microscope (biopsy). If any potential problems are found, the tissue will be sent to a lab for testing.  If small polyps are found, your health care provider may remove them and have them checked for cancer cells.  The tube that was inserted into your anus will be slowly removed. The procedure may vary among health care providers and hospitals. What happens after the procedure?  Your blood pressure, heart rate, breathing rate, and blood oxygen level will be monitored until the medicines you were given have worn off.  Do not drive for 24 hours after the exam.  You may have a small amount of blood in your stool.  You may pass gas and have mild abdominal cramping or bloating due to the air that was used to inflate your colon during the exam.  It is up to you to get the results of your procedure. Ask your health care provider, or the department performing the procedure, when your results will be ready. This information is not intended to  replace advice given to you by your health care provider. Make sure you discuss any questions you have with your health care provider. Document Released: 03/02/2000 Document Revised: 01/04/2016 Document Reviewed: 05/17/2015 Elsevier Interactive Patient Education  2017 [employer]  Colonoscopy, Adult, Care After This sheet gives you information about how to care for yourself after your procedure. Your health care provider may also give you more specific instructions. If you have problems or questions, contact your health care provider. What can I expect after the procedure? After the procedure, it is common to have:  A small amount of blood in your stool for 24 hours after the procedure.  Some gas.  Mild abdominal cramping or bloating. Follow these instructions at home: General instructions    For the first 24 hours after the procedure:  Do not drive or use machinery.  Do not sign important documents.  Do not drink alcohol.  Do your regular daily activities at a slower pace than normal.  Eat soft, easy-to-digest foods.  Rest often.  Take over-the-counter or prescription medicines only as told by your health care provider.  It is up to you to get the results of your procedure. Ask your health care provider, or the department performing the procedure, when your results will be ready. Relieving cramping and bloating   Try walking around when you have cramps or feel bloated.  Apply heat to your abdomen as told by your health care provider. Use a heat source that your health care provider recommends, such as a moist heat pack or a heating pad.  Place a towel between your skin and the heat source.  Leave the heat on for 20-30 minutes.  Remove the heat if your skin turns bright red. This is especially important if you are unable to feel pain, heat, or cold. You may have a greater risk of getting burned. Eating and drinking   Drink enough fluid to keep your urine clear or  pale yellow.  Resume your normal diet as instructed by your health care provider. Avoid heavy or fried foods that are hard to digest.  Avoid  drinking alcohol for as long as instructed by your health care provider. Contact a health care provider if:  You have blood in your stool 2-3 days after the procedure. Get help right away if:  You have more than a small spotting of blood in your stool.  You pass large blood clots in your stool.  Your abdomen is swollen.  You have nausea or vomiting.  You have a fever.  You have increasing abdominal pain that is not relieved with medicine. This information is not intended to replace advice given to you by your health care provider. Make sure you discuss any questions you have with your health care provider. Document Released: 10/18/2003 Document Revised: 11/28/2015 Document Reviewed: 05/17/2015 Elsevier Interactive Patient Education  2017 [place] Anesthesia, Adult General anesthesia is the use of medicines to make a person "go to sleep" (be unconscious) for a medical procedure. General anesthesia is often recommended when a procedure:  Is long.  Requires you to be still or in an unusual position.  Is major and can cause you to lose blood.  Is impossible to do without general anesthesia. The medicines used for general anesthesia are called general anesthetics. In addition to making you sleep, the medicines:  Prevent pain.  Control your blood pressure.  Relax your muscles. Tell a health care provider about:  Any allergies you have.  All medicines you are taking, including vitamins, herbs, eye drops, creams, and over-the-counter medicines.  Any problems you or family members have had with anesthetic medicines.  Types of anesthetics you have had in the past.  Any bleeding disorders you have.  Any surgeries you have had.  Any medical conditions you have.  Any history of heart or lung conditions, such as heart  failure, sleep apnea, or chronic obstructive pulmonary disease (COPD).  Whether you are pregnant or may be pregnant.  Whether you use tobacco, alcohol, marijuana, or street drugs.  Any history of Armed forces logistics/support/administrative officer.  Any history of depression or anxiety. What are the risks? Generally, this is a safe procedure. However, problems may occur, including:  Allergic reaction to anesthetics.  Lung and heart problems.  Inhaling food or liquids from your stomach into your lungs (aspiration).  Injury to nerves.  Waking up during your procedure and being unable to move (rare).  Extreme agitation or a state of mental confusion (delirium) when you wake up from the anesthetic.  Air in the bloodstream, which can lead to stroke. These problems are more likely to develop if you are having a major surgery or if you have an advanced medical condition. You can prevent some of these complications by answering all of your health care provider's questions thoroughly and by following all pre-procedure instructions. General anesthesia can cause side effects, including:  Nausea or vomiting  A sore throat from the breathing tube.  Feeling cold or shivery.  Feeling tired, washed out, or achy.  Sleepiness or drowsiness.  Confusion or agitation. What happens before the procedure? Staying hydrated  Follow instructions from your health care provider about hydration, which may include:  Up to 2 hours before the procedure - you may continue to drink clear liquids, such as water, clear fruit juice, black coffee, and plain tea. Eating and drinking restrictions  Follow instructions from your health care provider about eating and drinking, which may include:  8 hours before the procedure - stop eating heavy meals or foods such as meat, fried foods, or fatty foods.  6 hours before the  procedure - stop eating light meals or foods, such as toast or cereal.  6 hours before the procedure - stop drinking milk or  drinks that contain milk.  2 hours before the procedure - stop drinking clear liquids. Medicines   Ask your health care provider about:  Changing or stopping your regular medicines. This is especially important if you are taking diabetes medicines or blood thinners.  Taking medicines such as aspirin and ibuprofen. These medicines can thin your blood. Do not take these medicines before your procedure if your health care provider instructs you not to.  Taking new dietary supplements or medicines. Do not take these during the week before your procedure unless your health care provider approves them.  If you are told to take a medicine or to continue taking a medicine on the day of the procedure, take the medicine with sips of water. General instructions    Ask if you will be going home the same day, the following day, or after a longer hospital stay.  Plan to have someone take you home.  Plan to have someone stay with you for the first 24 hours after you leave the hospital or clinic.  For 3-6 weeks before the procedure, try not to use any tobacco products, such as cigarettes, chewing tobacco, and e-cigarettes.  You may brush your teeth on the morning of the procedure, but make sure to spit out the toothpaste. What happens during the procedure?  You will be given anesthetics through a mask and through an IV tube in one of your veins.  You may receive medicine to help you relax (sedative).  As soon as you are asleep, a breathing tube may be used to help you breathe.  An anesthesia specialist will stay with you throughout the procedure. He or she will help keep you comfortable and safe by continuing to give you medicines and adjusting the amount of medicine that you get. He or she will also watch your blood pressure, pulse, and oxygen levels to make sure that the anesthetics do not cause any problems.  If a breathing tube was used to help you breathe, it will be removed before you wake  up. The procedure may vary among health care providers and hospitals. What happens after the procedure?  You will wake up, often slowly, after the procedure is complete, usually in a recovery area.  Your blood pressure, heart rate, breathing rate, and blood oxygen level will be monitored until the medicines you were given have worn off.  You may be given medicine to help you calm down if you feel anxious or agitated.  If you will be going home the same day, your health care provider may check to make sure you can stand, drink, and urinate.  Your health care providers will treat your pain and side effects before you go home.  Do not drive for 24 hours if you received a sedative.  You may:  Feel nauseous and vomit.  Have a sore throat.  Have mental slowness.  Feel cold or shivery.  Feel sleepy.  Feel tired.  Feel sore or achy, even in parts of your body where you did not have surgery. This information is not intended to replace advice given to you by your health care provider. Make sure you discuss any questions you have with your health care provider. Document Released: 06/12/2007 Document Revised: 08/16/2015 Document Reviewed: 02/17/2015 Elsevier Interactive Patient Education  2017 [place], Care After These instructions  provide you with information about caring for yourself after your procedure. Your health care provider may also give you more specific instructions. Your treatment has been planned according to current medical practices, but problems sometimes occur. Call your health care provider if you have any problems or questions after your procedure. What can I expect after the procedure? After your procedure, it is common to:  Feel sleepy for several hours.  Feel clumsy and have poor balance for several hours.  Feel forgetful about what happened after the procedure.  Have poor judgment for several hours.  Feel nauseous or  vomit.  Have a sore throat if you had a breathing tube during the procedure. Follow these instructions at home: For at least 24 hours after the procedure:    Do not:  Participate in activities in which you could fall or become injured.  Drive.  Use heavy machinery.  Drink alcohol.  Take sleeping pills or medicines that cause drowsiness.  Make important decisions or sign legal documents.  Take care of children on your own.  Rest. Eating and drinking   Follow the diet that is recommended by your health care provider.  If you vomit, drink water, juice, or soup when you can drink without vomiting.  Make sure you have little or no nausea before eating solid foods. General instructions   Have a responsible adult stay with you until you are awake and alert.  Take over-the-counter and prescription medicines only as told by your health care provider.  If you smoke, do not smoke without supervision.  Keep all follow-up visits as told by your health care provider. This is important. Contact a health care provider if:  You keep feeling nauseous or you keep vomiting.  You feel light-headed.  You develop a rash.  You have a fever. Get help right away if:  You have trouble breathing. This information is not intended to replace advice given to you by your health care provider. Make sure you discuss any questions you have with your health care provider. Document Released: 06/26/2015 Document Revised: 10/26/2015 Document Reviewed: 06/26/2015 Elsevier Interactive Patient Education  2017 [employer].

## 2016-05-24 ENCOUNTER — Encounter (HOSPITAL_COMMUNITY): Payer: Self-pay

## 2016-05-24 ENCOUNTER — Encounter (HOSPITAL_COMMUNITY)
Admission: RE | Admit: 2016-05-24 | Discharge: 2016-05-24 | Disposition: A | Discharge status: Home / Self Care | Payer: Medicare Other | Source: Ambulatory Visit | Attending: Internal Medicine | Admitting: Internal Medicine

## 2016-05-24 DIAGNOSIS — Z888 Allergy status to other drugs, medicaments and biological substances status: Secondary | ICD-10-CM | POA: Diagnosis not present

## 2016-05-24 DIAGNOSIS — Z7982 Long term (current) use of aspirin: Secondary | ICD-10-CM | POA: Insufficient documentation

## 2016-05-24 DIAGNOSIS — Z01812 Encounter for preprocedural laboratory examination: Secondary | ICD-10-CM | POA: Diagnosis not present

## 2016-05-24 DIAGNOSIS — Z79899 Other long term (current) drug therapy: Secondary | ICD-10-CM | POA: Insufficient documentation

## 2016-05-24 DIAGNOSIS — R1319 Other dysphagia: Secondary | ICD-10-CM | POA: Insufficient documentation

## 2016-05-24 DIAGNOSIS — K219 Gastro-esophageal reflux disease without esophagitis: Secondary | ICD-10-CM | POA: Insufficient documentation

## 2016-05-24 DIAGNOSIS — Z01810 Encounter for preprocedural cardiovascular examination: Secondary | ICD-10-CM | POA: Diagnosis not present

## 2016-05-24 DIAGNOSIS — K529 Noninfective gastroenteritis and colitis, unspecified: Secondary | ICD-10-CM | POA: Diagnosis not present

## 2016-05-24 LAB — CBC
HEMATOCRIT: 42.1 % (ref 36.0–46.0)
Hemoglobin: 14.3 g/dL (ref 12.0–15.0)
MCH: 29.1 pg (ref 26.0–34.0)
MCHC: 34 g/dL (ref 30.0–36.0)
MCV: 85.7 fL (ref 78.0–100.0)
Platelets: 179 10*3/uL (ref 150–400)
RBC: 4.91 MIL/uL (ref 3.87–5.11)
RDW: 13.3 % (ref 11.5–15.5)
WBC: 4.4 10*3/uL (ref 4.0–10.5)

## 2016-05-24 LAB — BASIC METABOLIC PANEL
ANION GAP: 6 (ref 5–15)
BUN: 18 mg/dL (ref 6–20)
CALCIUM: 9.1 mg/dL (ref 8.9–10.3)
CO2: 28 mmol/L (ref 22–32)
Chloride: 105 mmol/L (ref 101–111)
Creatinine, Ser: 0.99 mg/dL (ref 0.44–1.00)
GFR calc Af Amer: >60 mL/min (ref >60)
GFR calc non Af Amer: 55 mL/min — ABNORMAL LOW (ref >60)
GLUCOSE: 77 mg/dL (ref 65–99)
Potassium: 4 mmol/L (ref 3.5–5.1)
Sodium: 139 mmol/L (ref 135–145)

## 2016-05-31 ENCOUNTER — Ambulatory Visit (HOSPITAL_COMMUNITY): Payer: Medicare Other | Admitting: Anesthesiology

## 2016-05-31 ENCOUNTER — Encounter (HOSPITAL_COMMUNITY)
Admission: RE | Disposition: A | Discharge status: Home / Self Care | Payer: Self-pay | Source: Ambulatory Visit | Attending: Internal Medicine

## 2016-05-31 ENCOUNTER — Encounter (HOSPITAL_COMMUNITY): Payer: Self-pay | Admitting: *Deleted

## 2016-05-31 ENCOUNTER — Ambulatory Visit (HOSPITAL_COMMUNITY)
Admission: RE | Admit: 2016-05-31 | Discharge: 2016-05-31 | Disposition: A | Discharge status: Home / Self Care | Payer: Medicare Other | Source: Ambulatory Visit | Attending: Internal Medicine | Admitting: Internal Medicine

## 2016-05-31 DIAGNOSIS — M419 Scoliosis, unspecified: Secondary | ICD-10-CM | POA: Insufficient documentation

## 2016-05-31 DIAGNOSIS — K210 Gastro-esophageal reflux disease with esophagitis: Secondary | ICD-10-CM | POA: Insufficient documentation

## 2016-05-31 DIAGNOSIS — K317 Polyp of stomach and duodenum: Secondary | ICD-10-CM | POA: Insufficient documentation

## 2016-05-31 DIAGNOSIS — Z7982 Long term (current) use of aspirin: Secondary | ICD-10-CM | POA: Diagnosis not present

## 2016-05-31 DIAGNOSIS — K5730 Diverticulosis of large intestine without perforation or abscess without bleeding: Secondary | ICD-10-CM | POA: Diagnosis not present

## 2016-05-31 DIAGNOSIS — Z888 Allergy status to other drugs, medicaments and biological substances status: Secondary | ICD-10-CM | POA: Diagnosis not present

## 2016-05-31 DIAGNOSIS — M329 Systemic lupus erythematosus, unspecified: Secondary | ICD-10-CM | POA: Insufficient documentation

## 2016-05-31 DIAGNOSIS — K219 Gastro-esophageal reflux disease without esophagitis: Secondary | ICD-10-CM | POA: Diagnosis not present

## 2016-05-31 DIAGNOSIS — R197 Diarrhea, unspecified: Secondary | ICD-10-CM | POA: Diagnosis not present

## 2016-05-31 DIAGNOSIS — K209 Esophagitis, unspecified: Secondary | ICD-10-CM | POA: Diagnosis not present

## 2016-05-31 DIAGNOSIS — R4702 Dysphasia: Secondary | ICD-10-CM | POA: Diagnosis not present

## 2016-05-31 DIAGNOSIS — K529 Noninfective gastroenteritis and colitis, unspecified: Secondary | ICD-10-CM | POA: Diagnosis not present

## 2016-05-31 DIAGNOSIS — Z79899 Other long term (current) drug therapy: Secondary | ICD-10-CM | POA: Insufficient documentation

## 2016-05-31 DIAGNOSIS — R1310 Dysphagia, unspecified: Secondary | ICD-10-CM | POA: Diagnosis not present

## 2016-05-31 DIAGNOSIS — K3189 Other diseases of stomach and duodenum: Secondary | ICD-10-CM | POA: Diagnosis not present

## 2016-05-31 DIAGNOSIS — K2950 Unspecified chronic gastritis without bleeding: Secondary | ICD-10-CM | POA: Diagnosis not present

## 2016-05-31 HISTORY — PX: COLONOSCOPY WITH PROPOFOL: SHX5780

## 2016-05-31 HISTORY — PX: ESOPHAGOGASTRODUODENOSCOPY (EGD) WITH PROPOFOL: SHX5813

## 2016-05-31 SURGERY — COLONOSCOPY WITH PROPOFOL
Anesthesia: Monitor Anesthesia Care

## 2016-05-31 MED ORDER — EPHEDRINE SULFATE 50 MG/ML IJ SOLN
INTRAMUSCULAR | Status: DC | PRN
  Administered 2016-05-31: 10 mg via INTRAVENOUS

## 2016-05-31 MED ORDER — MIDAZOLAM HCL 2 MG/2ML IJ SOLN
INTRAMUSCULAR | Status: AC
  Filled 2016-05-31: qty 2

## 2016-05-31 MED ORDER — FENTANYL CITRATE (PF) 100 MCG/2ML IJ SOLN
INTRAMUSCULAR | Status: AC
  Filled 2016-05-31: qty 2

## 2016-05-31 MED ORDER — LIDOCAINE HCL (PF) 1 % IJ SOLN
INTRAMUSCULAR | Status: AC
  Filled 2016-05-31: qty 15

## 2016-05-31 MED ORDER — MIDAZOLAM HCL 5 MG/5ML IJ SOLN
INTRAMUSCULAR | Status: DC | PRN
  Administered 2016-05-31: 2 mg via INTRAVENOUS

## 2016-05-31 MED ORDER — MIDAZOLAM HCL 2 MG/2ML IJ SOLN
1.0000 mg | INTRAMUSCULAR | Status: AC
  Administered 2016-05-31: 2 mg via INTRAVENOUS

## 2016-05-31 MED ORDER — FENTANYL CITRATE (PF) 100 MCG/2ML IJ SOLN
25.0000 ug | Freq: Once | INTRAMUSCULAR | Status: AC
  Administered 2016-05-31: 25 ug via INTRAVENOUS

## 2016-05-31 MED ORDER — LACTATED RINGERS IV SOLN
INTRAVENOUS | Status: DC
  Administered 2016-05-31 (×2): via INTRAVENOUS

## 2016-05-31 MED ORDER — LIDOCAINE VISCOUS 2 % MT SOLN
OROMUCOSAL | Status: AC
  Filled 2016-05-31: qty 15

## 2016-05-31 MED ORDER — LIDOCAINE HCL (CARDIAC) 10 MG/ML IV SOLN
INTRAVENOUS | Status: DC | PRN
  Administered 2016-05-31: 40 mg via INTRAVENOUS

## 2016-05-31 MED ORDER — PROPOFOL 500 MG/50ML IV EMUL
INTRAVENOUS | Status: DC | PRN
  Administered 2016-05-31: 150 ug/kg/min via INTRAVENOUS

## 2016-05-31 MED ORDER — LIDOCAINE VISCOUS 2 % MT SOLN
15.0000 mL | Freq: Once | OROMUCOSAL | Status: AC
  Administered 2016-05-31: 5 mL via OROMUCOSAL

## 2016-05-31 MED ORDER — PROPOFOL 10 MG/ML IV BOLUS
INTRAVENOUS | Status: AC
  Filled 2016-05-31: qty 20

## 2016-05-31 NOTE — Anesthesia Postprocedure Evaluation (Signed)
Anesthesia Post Note  Patient: Sheila Oliver  Procedure(s) Performed: Procedure(s) (LRB): COLONOSCOPY WITH PROPOFOL (N/A) ESOPHAGOGASTRODUODENOSCOPY (EGD) WITH PROPOFOL (N/A)  Patient location during evaluation: PACU Anesthesia Type: MAC Level of consciousness: awake and alert and oriented Pain management: pain level controlled Vital Signs Assessment: post-procedure vital signs reviewed and stable Respiratory status: spontaneous breathing Cardiovascular status: stable Anesthetic complications: no     Last Vitals:  Vitals:   05/31/16 0845 05/31/16 0900  BP: 118/77 121/73  Resp: 12 13  Temp:      Last Pain:  Vitals:   05/31/16 0803  TempSrc: Oral                 Gavriel Holzhauer A

## 2016-05-31 NOTE — Op Note (Signed)
[facility] Patient Name: Sheila Oliver Procedure Date: 05/31/2016 8:55 AM MRN: [id number] Date of Birth: 08-19-1940 Attending MD: Norvel Richards , MD CSN: [id number] Age: 76 Admit Type: Outpatient Procedure:                Upper GI endoscopy Indications:              Dysphagia, Heartburn Providers:                Norvel Richards, MD, Jeanann Lewandowsky. Sharon Seller, RN,                            Aram Candela Referring MD:              Medicines:                Propofol per Anesthesia Complications:            No immediate complications. Estimated Blood Loss:     Estimated blood loss was minimal. Procedure:                Pre-Anesthesia Assessment:                           - Prior to the procedure, a History and Physical                            was performed, and patient medications and                            allergies were reviewed. The patient's tolerance of                            previous anesthesia was also reviewed. The risks                            and benefits of the procedure and the sedation                            options and risks were discussed with the patient.                            All questions were answered, and informed consent                            was obtained. Prior Anticoagulants: The patient has                            taken no previous anticoagulant or antiplatelet                            agents. ASA Grade Assessment: II - A patient with                            mild systemic disease. After reviewing the risks  and benefits, the patient was deemed in                            satisfactory condition to undergo the procedure.                           After obtaining informed consent, the endoscope was                            passed under direct vision. Throughout the                            procedure, the patient's blood pressure, pulse, and                            oxygen saturations were  monitored continuously. The                            [id number]) scope was introduced through the                            and advanced to the second part of duodenum. The                            upper GI endoscopy was accomplished without                            difficulty. The patient tolerated the procedure                            well. Scope In: 9:13:19 AM Scope Out: 9:21:39 AM Total Procedure Duration: 0 hours 8 minutes 20 seconds  Findings:      LA Grade A (one or more mucosal breaks less than 5 mm, not extending       between tops of 2 mucosal folds) esophagitis was found 35 to 36 cm from       the incisors. No Barrett's epithelium seen. Tubular esophagus patent       throughout its course.      Multiple erosions were found in the gastric antrum. This was biopsied       with a cold forceps for histology. Estimated blood loss was minimal.      Multiple 2 mm pedunculated and sessile polyps were found in the gastric       fundus.      The duodenal bulb and second portion of the duodenum were normal. The       scope was withdrawn. Dilation was performed with a Maloney dilator with       mild resistance at 10 Fr. The dilation site was examined following       endoscope reinsertion and showed no change. Estimated blood loss: none.       gastric biopsy performed after dilation performed. Impression:               - LA Grade A esophagitis. Dilated.                           -  Erosive gastropathy. Biopsied.                           - Multiple gastric polyps.                           - Normal duodenal bulb and second portion of the                            duodenum. Moderate Sedation:      Moderate (conscious) sedation was personally administered by an       anesthesia professional. The following parameters were monitored: oxygen       saturation, heart rate, blood pressure, respiratory rate, EKG, adequacy       of pulmonary ventilation, and response to care.  Total physician       intraservice time was 10 minutes. Recommendation:           - Written discharge instructions were provided to                            the patient.                           - The signs and symptoms of potential delayed                            complications were discussed with the patient.                           - Patient has a contact number available for                            emergencies.                           - Return to normal activities tomorrow.                           - Resume previous diet.                           - Continue present medications. Begin rabeprazole                            20 mg daily.See colonoscopy report.                           - No repeat upper endoscopy.                           - Return to GI office in 2 months. Procedure Code(s):        --- Professional ---                           [phone], Esophagogastroduodenoscopy, flexible,  transoral; with biopsy, single or multiple                           43450, Dilation of esophagus, by unguided sound or                            bougie, single or multiple passes Diagnosis Code(s):        --- Professional ---                           K20.9, Esophagitis, unspecified                           K31.89, Other diseases of stomach and duodenum                           K31.7, Polyp of stomach and duodenum                           R13.10, Dysphagia, unspecified                           R12, Heartburn CPT copyright 2016 [employer]. All rights reserved. The codes documented in this report are preliminary and upon coder review may  be revised to meet current compliance requirements. Cristopher Estimable. Percilla Tweten, MD Norvel Richards, MD 05/31/2016 9:48:13 AM This report has been signed electronically. Number of Addenda: 0

## 2016-05-31 NOTE — Interval H&P Note (Signed)
History and Physical Interval Note:  05/31/2016 7:59 AM  Sheila Oliver  has presented today for surgery, with the diagnosis of GERD/DYSPHAGIA/CHRONIC DIARRHEA  The various methods of treatment have been discussed with the patient and family. After consideration of risks, benefits and other options for treatment, the patient has consented to  Procedure(s) with comments: COLONOSCOPY WITH PROPOFOL (N/A) - 930  ESOPHAGOGASTRODUODENOSCOPY (EGD) WITH PROPOFOL (N/A) as a surgical intervention .  The patient's history has been reviewed, patient examined, no change in status, stable for surgery.  I have reviewed the patient's chart and labs.  Questions were answered to the patient's satisfaction.     Sheila Oliver  No change. EGD/ED as feasible/appropriate along with diagnostic colonoscopy per plan.  The risks, benefits, limitations, imponderables and alternatives regarding both EGD and colonoscopy have been reviewed with the patient. Questions have been answered. All parties agreeable.

## 2016-05-31 NOTE — H&P (View-Only) (Signed)
Primary Care Physician:  Monico Blitz, MD Primary Gastroenterologist:  Dr. Gala Romney  Pre-Procedure History & Physical: HPI:  Sheila Oliver (AKA ELYSSE Oliver) is a 76 y.o. female here for further evaluation of chronic diarrhea. Has multiple loose stools daily -nonbloody. It's been going on for years. She has declined a colonoscopy previously. No family history of inflammatory bowel disease or colon cancer. She does make the observation which she takes a boost beverage it constipates her for about a day. She has occasional fecal incontinence.  Long-standing GERD. Was on Prilosec 20 mg daily-Read an article relating to kidney damage and Prilosec. Stopped taking his medications. Now with recurrent reflux symptoms. Also, notes recurrent esophageal dysphagia for years. She reminded me, that back in 2006, she underwent EGD by me which revealed a subtle Schatzki's ring. It was dilated with a [facility]. This will resulted in resolution of her dysphagia until the past couple of years.  Patient states she's lost 15 pounds over the past year - unintentionally.  History of scoliosis. Sees Dr. Lolita Rieger, rheumatologist in [place]. Has a positive lupus antibody.  No past medical history on file.  No past surgical history on file.  Prior to Admission medications   Medication Sig Start Date End Date Taking? Authorizing Provider  aspirin EC 81 MG tablet Take 81 mg by mouth daily.   Yes Historical Provider, MD  CALCIUM-VITAMIN D PO Take 1 tablet by mouth 2 (two) times daily. Calcium 600mg  + Vitamin D 800   Yes Historical Provider, MD  diclofenac sodium (VOLTAREN) 1 % GEL Apply topically as needed.   Yes Historical Provider, MD  LORazepam (ATIVAN) 0.5 MG tablet Take 0.5 mg by mouth daily.   Yes Historical Provider, MD  traMADol (ULTRAM) 50 MG tablet Take 50 mg by mouth as needed.   Yes Historical Provider, MD  traZODone (DESYREL) 50 MG tablet Take 50 mg by mouth at bedtime.   Yes Historical  Provider, MD  polyethylene glycol-electrolytes (TRILYTE) 420 g solution Take 4,000 mLs by mouth as directed. 05/01/16   Daneil Dolin, MD    Allergies as of 05/01/2016 - Review Complete 05/01/2016  Allergen Reaction Noted  . Cortisone  05/01/2016    No family history on file.  Social History   Social History  . Marital status: Widowed    Spouse name: N/A  . Number of children: N/A  . Years of education: N/A   Occupational History  . Not on file.   Social History Main Topics  . Smoking status: Never Smoker  . Smokeless tobacco: Never Used  . Alcohol use No  . Drug use: No  . Sexual activity: Not on file   Other Topics Concern  . Not on file   Social History Narrative  . No narrative on file    Review of Systems: See HPI, otherwise negative ROS  Physical Exam: BP 139/79   Pulse 79   Temp 97.7 F (36.5 C) (Oral)   Ht 5\' 5"  (1.651 m)   Wt 120 lb 9.6 oz (54.7 kg)   BMI 20.07 kg/m  General:   Alert,   pleasant and cooperative in NAD Neck:  Supple; no masses or thyromegaly. No significant cervical adenopathy. Lungs:  Clear throughout to auscultation.   No wheezes, crackles, or rhonchi. No acute distress. Heart:  Regular rate and rhythm; no murmurs, clicks, rubs,  or gallops. Abdomen: Non-distended, normal bowel sounds.  Soft and nontender without appreciable mass or hepatosplenomegaly.  Pulses:  Normal pulses noted. Extremities:  Without clubbing or edema.  Impression:  Pleasant 76 year old lady with chronic diarrhea. No prior colonoscopy. Doubt an infectious agent this point in time. Also doubt occult inflammatory bowel disease but that would remain in the differential. Needs a colonoscopy for colorectal cancer screening and to further evaluate for the possibility of microscopic colitis.  Chronic GERD with recurrent esophageal dysphasia setting of a known Schatzki's ring. This too needs further evaluation. Weight loss subtle but is noted.  Patient needs EGD with  repeat dilation as feasible/appropriate.  Her Epic medical records need to be merged.  Recommendations:  I have offered the patient both an EGD with esophageal dilation as feasible/appropriate along with a diagnostic ileo-colonoscopy in the near future. Will utilize propofol. The risks, benefits, limitations, imponderables and alternatives regarding both EGD and colonoscopy have been reviewed with the patient. Questions have been answered. All parties agreeable.      Notice: This dictation was prepared with Dragon dictation along with smaller phrase technology. Any transcriptional errors that result from this process are unintentional and may not be corrected upon review.

## 2016-05-31 NOTE — Transfer of Care (Signed)
Immediate Anesthesia Transfer of Care Note  Patient: Sheila Oliver  Procedure(s) Performed: Procedure(s) with comments: COLONOSCOPY WITH PROPOFOL (N/A) - 930  ESOPHAGOGASTRODUODENOSCOPY (EGD) WITH PROPOFOL (N/A)  Patient Location: PACU  Anesthesia Type:MAC  Level of Consciousness: awake, alert , oriented and patient cooperative  Airway & Oxygen Therapy: Patient Spontanous Breathing and Patient connected to face mask oxygen  Post-op Assessment: Report given to RN and Post -op Vital signs reviewed and stable  Post vital signs: Reviewed and stable  Last Vitals:  Vitals:   05/31/16 0845 05/31/16 0900  BP: 118/77 121/73  Resp: 12 13  Temp:      Last Pain:  Vitals:   05/31/16 0803  TempSrc: Oral      Patients Stated Pain Goal: 8 (84/53/64 6803)  Complications: No apparent anesthesia complications

## 2016-05-31 NOTE — Discharge Instructions (Addendum)
°Colonoscopy °Discharge Instructions ° °Read the instructions outlined below and refer to this sheet in the next few weeks. These discharge instructions provide you with general information on caring for yourself after you leave the hospital. Your doctor may also give you specific instructions. While your treatment has been planned according to the most current medical practices available, unavoidable complications occasionally occur. If you have any problems or questions after discharge, call Dr. Rourk at [phone]. °ACTIVITY °· You may resume your regular activity, but move at a slower pace for the next 24 hours.  °· Take frequent rest periods for the next 24 hours.  °· Walking will help get rid of the air and reduce the bloated feeling in your belly (abdomen).  °· No driving for 24 hours (because of the medicine (anesthesia) used during the test).   °· Do not sign any important legal documents or operate any machinery for 24 hours (because of the anesthesia used during the test).  °NUTRITION °· Drink plenty of fluids.  °· You may resume your normal diet as instructed by your doctor.  °· Begin with a light meal and progress to your normal diet. Heavy or fried foods are harder to digest and may make you feel sick to your stomach (nauseated).  °· Avoid alcoholic beverages for 24 hours or as instructed.  °MEDICATIONS °· You may resume your normal medications unless your doctor tells you otherwise.  °WHAT YOU CAN EXPECT TODAY °· Some feelings of bloating in the abdomen.  °· Passage of more gas than usual.  °· Spotting of blood in your stool or on the toilet paper.  °IF YOU HAD POLYPS REMOVED DURING THE COLONOSCOPY: °· No aspirin products for 7 days or as instructed.  °· No alcohol for 7 days or as instructed.  °· Eat a soft diet for the next 24 hours.  °FINDING OUT THE RESULTS OF YOUR TEST °Not all test results are available during your visit. If your test results are not back during the visit, make an appointment  with your caregiver to find out the results. Do not assume everything is normal if you have not heard from your caregiver or the medical facility. It is important for you to follow up on all of your test results.  °SEEK IMMEDIATE MEDICAL ATTENTION IF: °· You have more than a spotting of blood in your stool.  °· Your belly is swollen (abdominal distention).  °· You are nauseated or vomiting.  °· You have a temperature over 101.  °· You have abdominal pain or discomfort that is severe or gets worse throughout the day.  °EGD °Discharge instructions °Please read the instructions outlined below and refer to this sheet in the next few weeks. These discharge instructions provide you with general information on caring for yourself after you leave the hospital. Your doctor may also give you specific instructions. While your treatment has been planned according to the most current medical practices available, unavoidable complications occasionally occur. If you have any problems or questions after discharge, please call your doctor. °ACTIVITY °· You may resume your regular activity but move at a slower pace for the next 24 hours.  °· Take frequent rest periods for the next 24 hours.  °· Walking will help expel (get rid of) the air and reduce the bloated feeling in your abdomen.  °· No driving for 24 hours (because of the anesthesia (medicine) used during the test).  °· You may shower.  °· Do not sign any important   legal documents or operate any machinery for 24 hours (because of the anesthesia used during the test).  NUTRITION  Drink plenty of fluids.   You may resume your normal diet.   Begin with a light meal and progress to your normal diet.   Avoid alcoholic beverages for 24 hours or as instructed by your caregiver.  MEDICATIONS  You may resume your normal medications unless your caregiver tells you otherwise.  WHAT YOU CAN EXPECT TODAY  You may experience abdominal discomfort such as a feeling of fullness  or gas pains.  FOLLOW-UP  Your doctor will discuss the results of your test with you.  SEEK IMMEDIATE MEDICAL ATTENTION IF ANY OF THE FOLLOWING OCCUR:  Excessive nausea (feeling sick to your stomach) and/or vomiting.   Severe abdominal pain and distention (swelling).   Trouble swallowing.   Temperature over 101 F (37.8 C).   Rectal bleeding or vomiting of blood.    Gastroesophageal Reflux Disease, Adult Normally, food travels down the esophagus and stays in the stomach to be digested. However, when a person has gastroesophageal reflux disease (GERD), food and stomach acid move back up into the esophagus. When this happens, the esophagus becomes sore and inflamed. Over time, GERD can create small holes (ulcers) in the lining of the esophagus. What are the causes? This condition is caused by a problem with the muscle between the esophagus and the stomach (lower esophageal sphincter, or LES). Normally, the LES muscle closes after food passes through the esophagus to the stomach. When the LES is weakened or abnormal, it does not close properly, and that allows food and stomach acid to go back up into the esophagus. The LES can be weakened by certain dietary substances, medicines, and medical conditions, including:  Tobacco use.  Pregnancy.  Having a hiatal hernia.  Heavy alcohol use.  Certain foods and beverages, such as coffee, chocolate, onions, and peppermint. What increases the risk? This condition is more likely to develop in:  People who have an increased body weight.  People who have connective tissue disorders.  People who use NSAID medicines. What are the signs or symptoms? Symptoms of this condition include:  Heartburn.  Difficult or painful swallowing.  The feeling of having a lump in the throat.  Abitter taste in the mouth.  Bad breath.  Having a large amount of saliva.  Having an upset or bloated stomach.  Belching.  Chest pain.  Shortness of  breath or wheezing.  Ongoing (chronic) cough or a night-time cough.  Wearing away of tooth enamel.  Weight loss. Different conditions can cause chest pain. Make sure to see your health care provider if you experience chest pain. How is this diagnosed? Your health care provider will take a medical history and perform a physical exam. To determine if you have mild or severe GERD, your health care provider may also monitor how you respond to treatment. You may also have other tests, including:  An endoscopy toexamine your stomach and esophagus with a small camera.  A test thatmeasures the acidity level in your esophagus.  A test thatmeasures how much pressure is on your esophagus.  A barium swallow or modified barium swallow to show the shape, size, and functioning of your esophagus. How is this treated? The goal of treatment is to help relieve your symptoms and to prevent complications. Treatment for this condition may vary depending on how severe your symptoms are. Your health care provider may recommend:  Changes to your diet.  Medicine.  Surgery. Follow these instructions at home: Diet   Follow a diet as recommended by your health care provider. This may involve avoiding foods and drinks such as:  Coffee and tea (with or without caffeine).  Drinks that containalcohol.  Energy drinks and sports drinks.  Carbonated drinks or sodas.  Chocolate and cocoa.  Peppermint and mint flavorings.  Garlic and onions.  Horseradish.  Spicy and acidic foods, including peppers, chili powder, curry powder, vinegar, hot sauces, and barbecue sauce.  Citrus fruit juices and citrus fruits, such as oranges, lemons, and limes.  Tomato-based foods, such as red sauce, chili, salsa, and pizza with red sauce.  Fried and fatty foods, such as donuts, french fries, potato chips, and high-fat dressings.  High-fat meats, such as hot dogs and fatty cuts of red and white meats, such as rib  eye steak, sausage, ham, and bacon.  High-fat dairy items, such as whole milk, butter, and cream cheese.  Eat small, frequent meals instead of large meals.  Avoid drinking large amounts of liquid with your meals.  Avoid eating meals during the 2-3 hours before bedtime.  Avoid lying down right after you eat.  Do not exercise right after you eat. General instructions   Pay attention to any changes in your symptoms.  Take over-the-counter and prescription medicines only as told by your health care provider. Do not take aspirin, ibuprofen, or other NSAIDs unless your health care provider told you to do so.  Do not use any tobacco products, including cigarettes, chewing tobacco, and e-cigarettes. If you need help quitting, ask your health care provider.  Wear loose-fitting clothing. Do not wear anything tight around your waist that causes pressure on your abdomen.  Raise (elevate) the head of your bed 6 inches (15cm).  Try to reduce your stress, such as with yoga or meditation. If you need help reducing stress, ask your health care provider.  If you are overweight, reduce your weight to an amount that is healthy for you. Ask your health care provider for guidance about a safe weight loss goal.  Keep all follow-up visits as told by your health care provider. This is important. Contact a health care provider if:  You have new symptoms.  You have unexplained weight loss.  You have difficulty swallowing, or it hurts to swallow.  You have wheezing or a persistent cough.  Your symptoms do not improve with treatment.  You have a hoarse voice. Get help right away if:  You have pain in your arms, neck, jaw, teeth, or back.  You feel sweaty, dizzy, or light-headed.  You have chest pain or shortness of breath.  You vomit and your vomit looks like blood or coffee grounds.  You faint.  Your stool is bloody or black.  You cannot swallow, drink, or eat. This information is not  intended to replace advice given to you by your health care provider. Make sure you discuss any questions you have with your health care provider. Document Released: 12/13/2004 Document Revised: 08/03/2015 Document Reviewed: 06/30/2014 Elsevier Interactive Patient Education  2017 [employer]   Diverticulosis Diverticulosis is a condition that develops when small pouches (diverticula) form in the wall of the large intestine (colon). The colon is where water is absorbed and stool is formed. The pouches form when the inside layer of the colon pushes through weak spots in the outer layers of the colon. You may have a few pouches or many of them. What are the causes? The cause of this  condition is not known. What increases the risk? The following factors may make you more likely to develop this condition:  Being older than age 33. Your risk for this condition increases with age. Diverticulosis is rare among people younger than age 36. By age 6, many people have it.  Eating a low-fiber diet.  Having frequent constipation.  Being overweight.  Not getting enough exercise.  Smoking.  Taking over-the-counter pain medicines, like aspirin and ibuprofen.  Having a family history of diverticulosis. What are the signs or symptoms? In most people, there are no symptoms of this condition. If you do have symptoms, they may include:  Bloating.  Cramps in the abdomen.  Constipation or diarrhea.  Pain in the lower left side of the abdomen. How is this diagnosed? This condition is most often diagnosed during an exam for other colon problems. Because diverticulosis usually has no symptoms, it often cannot be diagnosed independently. This condition may be diagnosed by:  Using a flexible scope to examine the colon (colonoscopy).  Taking an X-ray of the colon after dye has been put into the colon (barium enema).  Doing a CT scan. How is this treated? You may not need treatment for this  condition if you have never developed an infection related to diverticulosis. If you have had an infection before, treatment may include:  Eating a high-fiber diet. This may include eating more fruits, vegetables, and grains.  Taking a fiber supplement.  Taking a live bacteria supplement (probiotic).  Taking medicine to relax your colon.  Taking antibiotic medicines. Follow these instructions at home:  Drink 6-8 glasses of water or more each day to prevent constipation.  Try not to strain when you have a bowel movement.  If you have had an infection before:  Eat more fiber as directed by your health care provider or your diet and nutrition specialist (dietitian).  Take a fiber supplement or probiotic, if your health care provider approves.  Take over-the-counter and prescription medicines only as told by your health care provider.  If you were prescribed an antibiotic, take it as told by your health care provider. Do not stop taking the antibiotic even if you start to feel better.  Keep all follow-up visits as told by your health care provider. This is important. Contact a health care provider if:  You have pain in your abdomen.  You have bloating.  You have cramps.  You have not had a bowel movement in 3 days. Get help right away if:  Your pain gets worse.  Your bloating becomes very bad.  You have a fever or chills, and your symptoms suddenly get worse.  You vomit.  You have bowel movements that are bloody or black.  You have bleeding from your rectum. Summary  Diverticulosis is a condition that develops when small pouches (diverticula) form in the wall of the large intestine (colon).  You may have a few pouches or many of them.  This condition is most often diagnosed during an exam for other colon problems.  If you have had an infection related to diverticulosis, treatment may include increasing the fiber in your diet, taking supplements, or taking  medicines. This information is not intended to replace advice given to you by your health care provider. Make sure you discuss any questions you have with your health care provider. Document Released: 12/01/2003 Document Revised: 01/23/2016 Document Reviewed: 01/23/2016 Elsevier Interactive Patient Education  2017 [employer]vier Inc.  GERD and Diverticulosis information provided  Begin AcipHex  or rabeprazole 20 mg daily  Further recommendations to follow pending review of pathology report  Office visit with [place] in 8 weeks.

## 2016-05-31 NOTE — Anesthesia Preprocedure Evaluation (Signed)
Anesthesia Evaluation  Patient identified by MRN, date of birth, ID band Patient awake    Reviewed: Allergy & Precautions, NPO status , Patient's Chart, lab work & pertinent test results  Airway Mallampati: I  TM Distance: >3 FB     Dental  (+) Teeth Intact   Pulmonary neg pulmonary ROS,    breath sounds clear to auscultation       Cardiovascular negative cardio ROS   Rhythm:Regular Rate:Normal     Neuro/Psych    GI/Hepatic GERD  ,  Endo/Other    Renal/GU      Musculoskeletal   Abdominal   Peds  Hematology   Anesthesia Other Findings   Reproductive/Obstetrics                             Anesthesia Physical Anesthesia Plan  ASA: II  Anesthesia Plan: MAC   Post-op Pain Management:    Induction: Intravenous  Airway Management Planned: Simple Face Mask  Additional Equipment:   Intra-op Plan:   Post-operative Plan:   Informed Consent: I have reviewed the patients History and Physical, chart, labs and discussed the procedure including the risks, benefits and alternatives for the proposed anesthesia with the patient or authorized representative who has indicated his/her understanding and acceptance.     Plan Discussed with:   Anesthesia Plan Comments:         Anesthesia Quick Evaluation

## 2016-05-31 NOTE — Op Note (Signed)
[facility] Patient Name: Sheila Oliver Procedure Date: 05/31/2016 9:24 AM MRN: [id number] Date of Birth: April 06, 1940 Attending MD: Norvel Richards , MD CSN: [id number] Age: 76 Admit Type: Outpatient Procedure:                Ileo-colonoscopy with segmental biopsy Indications:              Chronic diarrhea Providers:                Norvel Richards, MD, Gwenlyn Fudge RN, RN,                            Aram Candela Referring MD:              Medicines:                Propofol per Anesthesia Complications:            No immediate complications. Estimated Blood Loss:     Estimated blood loss was minimal. Procedure:                Pre-Anesthesia Assessment:                           - Prior to the procedure, a History and Physical                            was performed, and patient medications and                            allergies were reviewed. The patient's tolerance of                            previous anesthesia was also reviewed. The risks                            and benefits of the procedure and the sedation                            options and risks were discussed with the patient.                            All questions were answered, and informed consent                            was obtained. Prior Anticoagulants: The patient has                            taken no previous anticoagulant or antiplatelet                            agents. ASA Grade Assessment: II - A patient with                            mild systemic disease. After reviewing the risks  and benefits, the patient was deemed in                            satisfactory condition to undergo the procedure.                           After obtaining informed consent, the colonoscope                            was passed under direct vision. Throughout the                            procedure, the patient's blood pressure, pulse, and                            oxygen  saturations were monitored continuously. The                            EC-[id number] ([id number]) scope was introduced through                            the and advanced to the 5 cm into the ileum. The                            colonoscopy was performed without difficulty. The                            entire colon was well visualized. The quality of                            the bowel preparation was adequate. The terminal                            ileum, ileocecal valve, appendiceal orifice, and                            rectum were photographed. Scope In: 9:27:31 AM Scope Out: 9:42:08 AM Scope Withdrawal Time: 0 hours 6 minutes 9 seconds  Total Procedure Duration: 0 hours 14 minutes 37 seconds  Findings:      The perianal and digital rectal examinations were normal.      Multiple small and large-mouthed diverticula were found in the sigmoid       colon and descending colon. Segmental biopsies of the right and left       colon taken. The distal 5 cm of terminal ileal mucosa appeared normal.       Estimated blood loss was minimal.      The exam was otherwise without abnormality on direct and retroflexion       views. Impression:               - Diverticulosis in the sigmoid colon and in the                            descending colon. Biopsied.                           -  The examination was otherwise normal on direct                            and retroflexion views. Moderate Sedation:      Moderate (conscious) sedation was personally administered by an       anesthesia professional. The following parameters were monitored: oxygen       saturation, heart rate, blood pressure, respiratory rate, EKG, adequacy       of pulmonary ventilation, and response to care. Total physician       intraservice time was 31 minutes. Recommendation:           - Patient has a contact number available for                            emergencies. The signs and symptoms of potential                             delayed complications were discussed with the                            patient. Return to normal activities tomorrow.                            Written discharge instructions were provided to the                            patient.                           - Resume previous diet.                           - Continue present medications. Follow-up on                            pathology. See EGD report.                           - Procedure Code(s):        --- Professional ---                           [phone], Colonoscopy, flexible; with biopsy, single                            or multiple Diagnosis Code(s):        --- Professional ---                           K52.9, Noninfective gastroenteritis and colitis,                            unspecified                           K57.30, Diverticulosis of large intestine without  perforation or abscess without bleeding CPT copyright 2016 American Medical Association. All rights reserved. The codes documented in this report are preliminary and upon coder review may  be revised to meet current compliance requirements. Cristopher Estimable. Kalasia Crafton, MD Norvel Richards, MD 05/31/2016 9:53:23 AM This report has been signed electronically. Number of Addenda: 0

## 2016-06-01 ENCOUNTER — Telehealth: Payer: Self-pay | Admitting: Internal Medicine

## 2016-06-01 NOTE — Telephone Encounter (Signed)
Pt's husband called to say that patient had tcs/egd done yesterday. He said that her throat was hurting her and it's worse today where she can't talk. He also had questions about her insurance not covering the prescription for acid reflux and wanted to know if there was something else instead. He wants to speak with the nurse and get recommendations of what she can do about her hoarseness and sore throat. Please call [phone]

## 2016-06-01 NOTE — Telephone Encounter (Signed)
I called and spoke to pt's husband. He said the pt lost her voice 2 hours after procedures yesterday. She ate pizza, she is able to eat and drink with no problems. She just cannot talk above a whisper and her throat feels a little sore, but not a lot of pain.  I called the pharmacy about her prescription for the Rabeprazole and they said she just needs a PA and they will fax the paperwork to [place].  I am sending this note to Dr. Gala Romney and also will send him pager since he is at the hospital.

## 2016-06-01 NOTE — Telephone Encounter (Signed)
LMOM for a return call.  

## 2016-06-01 NOTE — Telephone Encounter (Signed)
I spoke to pt and husband. Informed of the recommendations. They will be able to pick up the PPI for $57.85 this time and next time it should not be as much after the PA.  They will call if pt does not improve.

## 2016-06-01 NOTE — Telephone Encounter (Signed)
Procedure note reviewed. Maybe having a little vocal cord spasm related to anesthetic and esophageal dilation. This should go away. For now, take PPI as recommended. Use Chloraseptic Spray for throat discomfort. Limit talking today. May continue to eat and drink. Would anticipate improvement over the next 24-48 hours. Should be fine.

## 2016-06-01 NOTE — Addendum Note (Signed)
Addendum  created 06/01/16 0740 by Vista Deck, CRNA   Charge Capture section accepted

## 2016-06-03 ENCOUNTER — Encounter: Payer: Self-pay | Admitting: Internal Medicine

## 2016-06-04 ENCOUNTER — Encounter (HOSPITAL_COMMUNITY): Payer: Self-pay | Admitting: Internal Medicine

## 2016-06-04 DIAGNOSIS — Z713 Dietary counseling and surveillance: Secondary | ICD-10-CM | POA: Diagnosis not present

## 2016-06-04 DIAGNOSIS — Z299 Encounter for prophylactic measures, unspecified: Secondary | ICD-10-CM | POA: Diagnosis not present

## 2016-06-04 DIAGNOSIS — M329 Systemic lupus erythematosus, unspecified: Secondary | ICD-10-CM | POA: Diagnosis not present

## 2016-06-04 DIAGNOSIS — E7800 Pure hypercholesterolemia, unspecified: Secondary | ICD-10-CM | POA: Diagnosis not present

## 2016-06-04 DIAGNOSIS — Z6821 Body mass index (BMI) 21.0-21.9, adult: Secondary | ICD-10-CM | POA: Diagnosis not present

## 2016-06-04 DIAGNOSIS — J069 Acute upper respiratory infection, unspecified: Secondary | ICD-10-CM | POA: Diagnosis not present

## 2016-06-05 DIAGNOSIS — M5136 Other intervertebral disc degeneration, lumbar region: Secondary | ICD-10-CM | POA: Diagnosis not present

## 2016-06-05 DIAGNOSIS — M4716 Other spondylosis with myelopathy, lumbar region: Secondary | ICD-10-CM | POA: Diagnosis not present

## 2016-06-14 DIAGNOSIS — K219 Gastro-esophageal reflux disease without esophagitis: Secondary | ICD-10-CM | POA: Diagnosis not present

## 2016-06-14 DIAGNOSIS — G4700 Insomnia, unspecified: Secondary | ICD-10-CM | POA: Diagnosis not present

## 2016-06-14 DIAGNOSIS — E7800 Pure hypercholesterolemia, unspecified: Secondary | ICD-10-CM | POA: Diagnosis not present

## 2016-06-14 DIAGNOSIS — M545 Low back pain: Secondary | ICD-10-CM | POA: Diagnosis not present

## 2016-06-14 DIAGNOSIS — Z789 Other specified health status: Secondary | ICD-10-CM | POA: Diagnosis not present

## 2016-06-14 DIAGNOSIS — Z6820 Body mass index (BMI) 20.0-20.9, adult: Secondary | ICD-10-CM | POA: Diagnosis not present

## 2016-06-14 DIAGNOSIS — Z299 Encounter for prophylactic measures, unspecified: Secondary | ICD-10-CM | POA: Diagnosis not present

## 2016-06-19 ENCOUNTER — Telehealth: Payer: Self-pay | Admitting: General Practice

## 2016-06-19 NOTE — Telephone Encounter (Signed)
Patient is coming in to see Magda Paganini in the morning at 11:00 am

## 2016-06-19 NOTE — Telephone Encounter (Signed)
Patient called in stating she has felt bad since she had her procedure.  She's been hoarse and feel like she has a burning sensation all over.  She stated that Dr. Gala Romney prescribed Aciphex for her, however it not covered by her insurance plan.   She can be reached at [phone].

## 2016-06-19 NOTE — Telephone Encounter (Signed)
I spoke with Almyra Free and she stated that PA has been done for her Aciphex and was approved on 3/16.

## 2016-06-20 ENCOUNTER — Ambulatory Visit (INDEPENDENT_AMBULATORY_CARE_PROVIDER_SITE_OTHER): Payer: Medicare Other | Admitting: Gastroenterology

## 2016-06-20 ENCOUNTER — Encounter: Payer: Self-pay | Admitting: Gastroenterology

## 2016-06-20 VITALS — BP 109/78 | HR 92 | Temp 97.9°F | Ht 65.5 in | Wt 122.6 lb

## 2016-06-20 DIAGNOSIS — K529 Noninfective gastroenteritis and colitis, unspecified: Secondary | ICD-10-CM

## 2016-06-20 DIAGNOSIS — Z8719 Personal history of other diseases of the digestive system: Secondary | ICD-10-CM | POA: Diagnosis not present

## 2016-06-20 DIAGNOSIS — R197 Diarrhea, unspecified: Secondary | ICD-10-CM | POA: Diagnosis not present

## 2016-06-20 NOTE — Progress Notes (Signed)
Primary Care Physician: Monico Blitz, MD  Primary Gastroenterologist:  Garfield Cornea, MD   Chief Complaint  Patient presents with  . Diarrhea    loose stool, burns when has bm  . Weight Loss    feels like she is loosing weight    HPI: Sheila Oliver is a 76 y.o. female here for procedure follow-up. She was initially seen back on 05/01/2016 for evaluation of chronic diarrhea. Multiple loose stools daily, nonbloody going on for several years. Long-standing GERD. Was on Prilosec 20 mg daily but stopped after she read an article relating to kidney damage and Prilosec. Years of esophageal dysphagia. Reported 15 pound unintentional weight loss over the past one year. 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 Lolita Rieger.  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 Distal 5 cm of terminal ileum appeared normal. Segmental biopsies were unremarkable. On EGD she had LA grade a esophagitis. Multiple erosions the gastric antrum with mild chronic gastritis on biopsies. No H. pylori. Stomach polyps were fundic gland polyps. Empirically dilated for history of dysphagia.  Within few hours after EGD, lost voice. Day later voice coming back but still hoarse however. Can't sing. Aciphex $57 out of pocket. Tier 3 for remaining year, But doesn't know how much that will cost. Has been on Aciphex for 3 weeks. She stopped several Supplements that her rheumatologist had started, including tart cherry juice, tumeric, cranberry juice, ginger root, Omega 3. Felt like she was having abdominal burning related to this. Have gastritis on EGD.  Patient states she hasn't felt right since her procedures. Since esophageal dilation she had episode of bread getting stuck in her throat area but otherwise no other episodes of dysphagia. Right after procedure she got an upper respiratory infection required  antibiotics and steroids. She notes she has lots of issues with sinusitis. Continues to have diarrhea but really does not quantify. Occasional days no bowel movement. Recent single solid stool after her colonoscopy but outside of that cannot remember the last time she had solid bowel movement. Complains of nocturnal bowel movements. No blood in the stool or melena. Diarrhea is been going on for years.  She's never tried Nexium or Dexilant long-term.   Current Outpatient Prescriptions  Medication Sig Dispense Refill  . Calcium Carb-Cholecalciferol (CALCIUM 600+D) 600-800 MG-UNIT TABS Take 1 tablet by mouth 2 (two) times daily.    Marland Kitchen lactose free nutrition (BOOST) LIQD Take 237 mLs by mouth every other day.     Marland Kitchen LORazepam (ATIVAN) 0.5 MG tablet Take 0.5 mg by mouth at bedtime.     . Multiple Vitamin (MULTIVITAMIN WITH MINERALS) TABS tablet Take 1 tablet by mouth daily. Women's Centrum Silver 50+    . Polyethyl Glycol-Propyl Glycol (SYSTANE ULTRA OP) Apply 1-2 drops to eye 3 (three) times daily as needed (for dry eyes.).    Marland Kitchen traMADol (ULTRAM) 50 MG tablet Take 50 mg by mouth every 6 (six) hours as needed (for pain).     . traZODone (DESYREL) 50 MG tablet Take 50 mg by mouth at bedtime.     No current facility-administered medications for this visit.     Allergies as of 06/20/2016 - Review Complete 06/20/2016  Allergen Reaction Noted  . Cortisone  05/01/2016    ROS:  General: Negative for anorexia, weight loss, fever, chills, fatigue, weakness. ENT: Negative for hoarseness, difficulty swallowing , nasal congestion. CV: Negative for  chest pain, angina, palpitations, dyspnea on exertion, peripheral edema.  Respiratory: Negative for dyspnea at rest, dyspnea on exertion, cough, sputum, wheezing.  GI: See history of present illness. GU:  Negative for dysuria, hematuria, urinary incontinence, urinary frequency, nocturnal urination.  Endo: Negative for unusual weight change.    Physical  Examination:   BP 109/78   Pulse 92   Temp 97.9 F (36.6 C) (Oral)   Ht 5' 5.5" (1.664 m)   Wt 122 lb 9.6 oz (55.6 kg)   BMI 20.09 kg/m   General: Well-nourished, well-developed in no acute distress.  Eyes: No icterus. Mouth: Oropharyngeal mucosa moist and pink , no lesions erythema or exudate. Lungs: Clear to auscultation bilaterally.  Heart: Regular rate and rhythm, no murmurs rubs or gallops.  Abdomen: Bowel sounds are normal, nontender, nondistended, no hepatosplenomegaly or masses, no abdominal bruits or hernia , no rebound or guarding.   Extremities: No lower extremity edema. No clubbing or deformities. Neuro: Alert and oriented x 4   Skin: Warm and dry, no jaundice.   Psych: Alert and cooperative, normal mood and affect.  Labs:  Lab Results  Component Value Date   WBC 4.4 05/24/2016   HGB 14.3 05/24/2016   HCT 42.1 05/24/2016   MCV 85.7 05/24/2016   PLT 179 05/24/2016   Lab Results  Component Value Date   CREATININE 0.99 05/24/2016   BUN 18 05/24/2016   NA 139 05/24/2016   K 4.0 05/24/2016   CL 105 05/24/2016   CO2 28 05/24/2016    Imaging Studies: No results found.

## 2016-06-20 NOTE — Patient Instructions (Signed)
1. Please go to pharmacy and find out how much your Aciphex will cost. If it is too expensive, let us know and we will try Nexium.  2. Please have your labs and stool test done as soon as possible.

## 2016-06-21 NOTE — Assessment & Plan Note (Signed)
Continues to complain of chronic diarrhea without known etiology. To cover bases we will obtain stool studies especially with recent antibiotic use. Check for celiac disease. Baseline labs.

## 2016-06-21 NOTE — Assessment & Plan Note (Signed)
Reflux esophagitis and gastritis on recent upper endoscopy. Encouraged PPI therapy. She will go and see how much AcipHex is going to cost. Advised that if hoarseness is related to reflux it may take some time to go completely away. Has been some significant improvement of post procedure laryngitis. Discussed antireflux measures. Continue to avoid supplements as outlined above.

## 2016-06-22 NOTE — Progress Notes (Signed)
cc'ed to pcp °

## 2016-06-25 ENCOUNTER — Other Ambulatory Visit: Payer: Self-pay | Admitting: Gastroenterology

## 2016-06-25 DIAGNOSIS — R197 Diarrhea, unspecified: Secondary | ICD-10-CM | POA: Diagnosis not present

## 2016-06-25 DIAGNOSIS — K529 Noninfective gastroenteritis and colitis, unspecified: Secondary | ICD-10-CM | POA: Diagnosis not present

## 2016-06-26 ENCOUNTER — Encounter: Payer: Self-pay | Admitting: Internal Medicine

## 2016-06-26 ENCOUNTER — Telehealth: Payer: Self-pay | Admitting: Gastroenterology

## 2016-06-26 LAB — COMPREHENSIVE METABOLIC PANEL
A/G RATIO: 1.6 (ref 1.2–2.2)
ALBUMIN: 4.1 g/dL (ref 3.5–4.8)
ALT: 15 IU/L (ref 0–32)
AST: 11 IU/L (ref 0–40)
Alkaline Phosphatase: 66 IU/L (ref 39–117)
BILIRUBIN TOTAL: 1.4 mg/dL — AB (ref 0.0–1.2)
BUN / CREAT RATIO: 18 (ref 12–28)
BUN: 18 mg/dL (ref 8–27)
CHLORIDE: 99 mmol/L (ref 96–106)
CO2: 26 mmol/L (ref 18–29)
Calcium: 9.3 mg/dL (ref 8.7–10.3)
Creatinine, Ser: 1 mg/dL (ref 0.57–1.00)
GFR calc non Af Amer: 56 mL/min/{1.73_m2} — ABNORMAL LOW (ref >59)
GFR, EST AFRICAN AMERICAN: 65 mL/min/{1.73_m2} (ref >59)
GLOBULIN, TOTAL: 2.5 g/dL (ref 1.5–4.5)
GLUCOSE: 96 mg/dL (ref 65–99)
POTASSIUM: 4.2 mmol/L (ref 3.5–5.2)
Sodium: 141 mmol/L (ref 134–144)
TOTAL PROTEIN: 6.6 g/dL (ref 6.0–8.5)

## 2016-06-26 LAB — GI PROFILE, STOOL, PCR
Adenovirus F 40/41: NOT DETECTED
Astrovirus: NOT DETECTED
C difficile toxin A/B: DETECTED — AB
CYCLOSPORA CAYETANENSIS: NOT DETECTED
Campylobacter: NOT DETECTED
Cryptosporidium: NOT DETECTED
ENTAMOEBA HISTOLYTICA: NOT DETECTED
Enteroaggregative E coli: NOT DETECTED
Enteropathogenic E coli: NOT DETECTED
Enterotoxigenic E coli: NOT DETECTED
GIARDIA LAMBLIA: NOT DETECTED
Norovirus GI/GII: NOT DETECTED
PLESIOMONAS SHIGELLOIDES: NOT DETECTED
Rotavirus A: NOT DETECTED
SALMONELLA: NOT DETECTED
SAPOVIRUS: NOT DETECTED
SHIGELLA/ENTEROINVASIVE E COLI: NOT DETECTED
Shiga-toxin-producing E coli: NOT DETECTED
VIBRIO CHOLERAE: NOT DETECTED
VIBRIO: NOT DETECTED
Yersinia enterocolitica: NOT DETECTED

## 2016-06-26 LAB — CBC WITH DIFFERENTIAL/PLATELET
BASOS: 0 %
Basophils Absolute: 0 10*3/uL (ref 0.0–0.2)
EOS (ABSOLUTE): 0.2 10*3/uL (ref 0.0–0.4)
Eos: 2 %
HEMOGLOBIN: 14 g/dL (ref 11.1–15.9)
Hematocrit: 42 % (ref 34.0–46.6)
Immature Grans (Abs): 0 10*3/uL (ref 0.0–0.1)
Immature Granulocytes: 0 %
LYMPHS: 41 %
Lymphocytes Absolute: 2.7 10*3/uL (ref 0.7–3.1)
MCH: 28.6 pg (ref 26.6–33.0)
MCHC: 33.3 g/dL (ref 31.5–35.7)
MCV: 86 fL (ref 79–97)
MONOCYTES: 11 %
Monocytes Absolute: 0.7 10*3/uL (ref 0.1–0.9)
NEUTROS ABS: 3 10*3/uL (ref 1.4–7.0)
Neutrophils: 46 %
Platelets: 219 10*3/uL (ref 150–379)
RBC: 4.89 x10E6/uL (ref 3.77–5.28)
RDW: 14.3 % (ref 12.3–15.4)
WBC: 6.6 10*3/uL (ref 3.4–10.8)

## 2016-06-26 LAB — IGA: IgA/Immunoglobulin A, Serum: 141 mg/dL (ref 64–422)

## 2016-06-26 LAB — TSH: TSH: 1.16 u[IU]/mL (ref 0.450–4.500)

## 2016-06-26 LAB — TISSUE TRANSGLUTAMINASE, IGA: Transglutaminase IgA: <2 U/mL (ref 0–3)

## 2016-06-26 MED ORDER — VANCOMYCIN HCL 125 MG PO CAPS: 125.0000 mg | ORAL_CAPSULE | Freq: Four times a day (QID) | ORAL | 0 refills | Status: DC

## 2016-06-26 NOTE — Telephone Encounter (Signed)
Pt is aware. Went over instructions with her.  Please schedule ov in 4 weeks with RMR.

## 2016-06-26 NOTE — Telephone Encounter (Signed)
Received labs from lab core dated 06/25/2016. White blood cell count 6600, hemoglobin 14, platelets 219,000, glucose 96, creatinine 1.0, total bilirubin 1.4, alkaline phosphatase 66, AST 11, ALT 15, albumin 4.1, TSH 1.60, IgA 141, TTG, IgA is pending.  GI pathogen panel performed, C. difficile toxin A/P was detected.  Recommend vancomycin 125mg  QID for 10 days. RX sent.  Use clorox based cleaner in her bathroom. Get a new toothbrush in 10 days.  Use soap and water for handwashing INSTEAD OF hand sanitizer.  If she was able to come off PPI that would be helpful but I'm not sure she can given recent UGI symptoms. TUMS would be fine.  Return to see rmr in four weeks.

## 2016-06-26 NOTE — Telephone Encounter (Signed)
APPT MADE AND LETTER SENT  °

## 2016-07-09 NOTE — Progress Notes (Signed)
Celiac negative. All other results previously addressed. Keep f/u with rmr as planned.

## 2016-07-10 ENCOUNTER — Telehealth: Payer: Self-pay | Admitting: Internal Medicine

## 2016-07-10 NOTE — Telephone Encounter (Signed)
Spoke with the pt, answered all her questions about her medications. Pt will call if she has any more questions or concerns.

## 2016-07-10 NOTE — Telephone Encounter (Signed)
Pt has questions about her aciphex. Please call her at [phone]

## 2016-07-17 DIAGNOSIS — Z79891 Long term (current) use of opiate analgesic: Secondary | ICD-10-CM | POA: Diagnosis not present

## 2016-07-17 DIAGNOSIS — G8929 Other chronic pain: Secondary | ICD-10-CM | POA: Diagnosis not present

## 2016-07-17 DIAGNOSIS — M4716 Other spondylosis with myelopathy, lumbar region: Secondary | ICD-10-CM | POA: Diagnosis not present

## 2016-07-17 DIAGNOSIS — M5136 Other intervertebral disc degeneration, lumbar region: Secondary | ICD-10-CM | POA: Diagnosis not present

## 2016-07-26 ENCOUNTER — Ambulatory Visit: Payer: Medicare Other | Admitting: Gastroenterology

## 2016-07-31 ENCOUNTER — Ambulatory Visit (INDEPENDENT_AMBULATORY_CARE_PROVIDER_SITE_OTHER): Payer: Medicare Other | Admitting: Internal Medicine

## 2016-07-31 ENCOUNTER — Encounter: Payer: Self-pay | Admitting: Internal Medicine

## 2016-07-31 VITALS — BP 142/84 | HR 81 | Temp 97.1°F | Ht 65.5 in | Wt 123.4 lb

## 2016-07-31 DIAGNOSIS — R197 Diarrhea, unspecified: Secondary | ICD-10-CM | POA: Diagnosis not present

## 2016-07-31 DIAGNOSIS — K529 Noninfective gastroenteritis and colitis, unspecified: Secondary | ICD-10-CM | POA: Diagnosis not present

## 2016-07-31 NOTE — Progress Notes (Signed)
Primary Care Physician:  Monico Blitz, MD Primary Gastroenterologist:  Dr. Gala Romney  Pre-Procedure History & Physical: HPI:  Sheila Oliver is a 76 y.o. female here for follow-up chronic diarrhea. Found to have C. difficile toxin positive. Treated with vancomycin. Diarrhea resolved. She states over the past week though she's had a couple loose stools semi-formed. No bleeding. Some abdominal "rumbling". Loose stools over the weekend. No bowel movement yesterday. No bleeding. She is back on a PPI and we have AcipHex 20 mg daily which is working well for her reflux. No probiotic. Reported hoarseness after her EGD. Those symptoms have now totally subsided.  Past Medical History:  Diagnosis Date  . DDD (degenerative disc disease), lumbar   . GERD (gastroesophageal reflux disease)   . Osteoporosis     Past Surgical History:  Procedure Laterality Date  . CATARACT EXTRACTION Left   . CERVICAL FUSION    . COLONOSCOPY WITH PROPOFOL N/A 05/31/2016   Procedure: COLONOSCOPY WITH PROPOFOL;  Surgeon: Daneil Dolin, MD;  [facility]: [facility];  Service: Endoscopy;  Laterality: N/A;  930   . ESOPHAGOGASTRODUODENOSCOPY (EGD) WITH PROPOFOL N/A 05/31/2016   Procedure: ESOPHAGOGASTRODUODENOSCOPY (EGD) WITH PROPOFOL;  Surgeon: Daneil Dolin, MD;  Location: [facility];  Service: Endoscopy;  Laterality: N/A;  . repair of rectal fissure    . SUPERFICIAL KERATECTOMY Bilateral     Prior to Admission medications   Medication Sig Start Date End Date Taking? Authorizing Provider  Calcium Carb-Cholecalciferol (CALCIUM 600+D) 600-800 MG-UNIT TABS Take 1 tablet by mouth 2 (two) times daily.   Yes [provider]  lactose free nutrition (BOOST) LIQD Take 237 mLs by mouth every other day.    Yes [provider]  LORazepam (ATIVAN) 0.5 MG tablet Take 0.5 mg by mouth at bedtime.    Yes [provider]  Multiple Vitamin (MULTIVITAMIN WITH MINERALS) TABS tablet Take 1 tablet by mouth  daily. Women's Centrum Silver 50+   Yes [provider]  Polyethyl Glycol-Propyl Glycol (SYSTANE ULTRA OP) Apply 1-2 drops to eye 3 (three) times daily as needed (for dry eyes.).   Yes [provider]  RABEprazole (ACIPHEX) 20 MG tablet Take 20 mg by mouth daily.   Yes [provider]  traMADol (ULTRAM) 50 MG tablet Take 50 mg by mouth every 6 (six) hours as needed (for pain).    Yes [provider]  traZODone (DESYREL) 50 MG tablet Take 50 mg by mouth at bedtime.   Yes [provider]  vancomycin (VANCOCIN) 125 MG capsule Take 1 capsule (125 mg total) by mouth 4 (four) times daily. For 10 days. Patient not taking: Reported on 07/31/2016 06/26/16   Mahala Menghini, PA-C    Allergies as of 07/31/2016 - Review Complete 07/31/2016  Allergen Reaction Noted  . Cortisone  05/01/2016    No family history on file.  Social History   Social History  . Marital status: Widowed    Spouse name: N/A  . Number of children: N/A  . Years of education: N/A   Occupational History  . Not on file.   Social History Main Topics  . Smoking status: Never Smoker  . Smokeless tobacco: Never Used  . Alcohol use No  . Drug use: No  . Sexual activity: Yes    Birth control/ protection: Post-menopausal   Other Topics Concern  . Not on file   Social History Narrative  . No narrative on file    Review of  Systems: See HPI, otherwise negative ROS  Physical Exam: BP (!) 142/84   Pulse 81   Temp 97.1 F (36.2 C) (Oral)   Ht 5' 5.5" (1.664 m)   Wt 123 lb 6.4 oz (56 kg)   BMI 20.22 kg/m  General:   Alert,   pleasant and cooperative in NAD Neck:  Supple; no masses or thyromegaly. No significant cervical adenopathy. Lungs:  Clear throughout to auscultation.   No wheezes, crackles, or rhonchi. No acute distress. Heart:  Regular rate and rhythm; no murmurs, clicks, rubs,  or gallops. Abdomen: Non-distended, normal bowel sounds.  Soft and nontender without  appreciable mass or hepatosplenomegaly.  Pulses:  Normal pulses noted. Extremities:  Without clubbing or edema.  Impression:  Very pleasant 76 year old lady with chronic diarrhea C. difficile toxin came back positive. Improved with vancomycin. Some loose stools in the past week nonspecific. Not as bad as it was pre-vancomycin. Patient with history of multiple sinus infections and been on multiple rounds of antibiotics including recently.  May or may not be a relapse of C. difficile.  GERD symptoms well controlled on AcipHex 20 mg daily.   Recommendations:  Continue Aciphex 20mg  daily  GERD information provided   Repeat stool sample for Cdiff toxin ASAP  Further recommendations to follow  If C. difficile negative, will likely add a probiotic to her regimen and follow her closely.      Notice: This dictation was prepared with Dragon dictation along with smaller phrase technology. Any transcriptional errors that result from this process are unintentional and may not be corrected upon review.

## 2016-07-31 NOTE — Patient Instructions (Signed)
Continue Aciphex 20mg  daily  GERD information provided   Repeat stool sample for Cdiff toxin ASAP  Further recommendations to follow

## 2016-08-02 ENCOUNTER — Encounter: Payer: Self-pay | Admitting: Internal Medicine

## 2016-08-02 LAB — CLOSTRIDIUM DIFFICILE EIA: C difficile Toxins A+B, EIA: NEGATIVE

## 2016-08-02 NOTE — Progress Notes (Unsigned)
C. difficile toxin assay came back negative. Please let patient know. Begin align probiotic 1 capsule daily. Office visit in 4-6 weeks. If diarrhea recurs in the interim, patient is to notify us.

## 2016-08-03 ENCOUNTER — Other Ambulatory Visit: Payer: Self-pay

## 2016-08-03 NOTE — Progress Notes (Signed)
Pt is aware of results. 

## 2016-08-03 NOTE — Progress Notes (Signed)
LMOM to call back

## 2016-08-06 MED ORDER — ALIGN 4 MG PO CAPS: 4.0000 mg | ORAL_CAPSULE | Freq: Every day | ORAL | 11 refills | Status: AC

## 2016-08-09 ENCOUNTER — Other Ambulatory Visit (HOSPITAL_COMMUNITY): Payer: Self-pay | Admitting: Internal Medicine

## 2016-08-09 DIAGNOSIS — Z1231 Encounter for screening mammogram for malignant neoplasm of breast: Secondary | ICD-10-CM

## 2016-08-20 ENCOUNTER — Telehealth: Payer: Self-pay

## 2016-08-20 NOTE — Telephone Encounter (Signed)
Pt called office asking to speak to Dr. Gala Romney or his nurse. She ate a chicken tender salad with [place] dressing, she then went to [employer] and had episode and felt like her throat was closing up and she was gasping for breath. She yelled for someone to get her some water. Someone at the pharmacy counter gave her some water. The water went down without any problem. She sat down for a little bit and got herself together. Stated she would have died if no one would have gotten her any water. She feels better now, but her throat is sore and voice is hoarse. States she is breathing fine now, no problem swallowing. She is wondering if this was episode of acid reflux. She takes Aciphex 20mg  daily. She took Aciphex this morning, the salad was the 1st thing she had ate today. Informed pt that if she feels her throat is closing up/she can't breathe she or someone should call 911 as it is an emegency. She then said it was no time for that when it occurred at [facility].  Informed pt I would send message to RMR.  Also routing to LSL d/t RMR is off.

## 2016-08-21 ENCOUNTER — Other Ambulatory Visit: Payer: Self-pay

## 2016-08-21 DIAGNOSIS — K2100 Gastro-esophageal reflux disease with esophagitis, without bleeding: Secondary | ICD-10-CM

## 2016-08-21 DIAGNOSIS — Z8379 Family history of other diseases of the digestive system: Secondary | ICD-10-CM

## 2016-08-21 DIAGNOSIS — K219 Gastro-esophageal reflux disease without esophagitis: Secondary | ICD-10-CM

## 2016-08-21 NOTE — Telephone Encounter (Signed)
Pt called office. Informed of appt.

## 2016-08-21 NOTE — Telephone Encounter (Signed)
Routing to AB d/t LSL and RMR's absence.

## 2016-08-21 NOTE — Telephone Encounter (Signed)
History of esophagitis, empiric dilation March 2018. I don't see where there has been a BPE completed.   Could have had severe GERD exacerbation, possible chicken bite was not chewed well enough. May have an underlying motility disorder. Continue to take Aciphex as prescribed.   Let's get a BPE on file. Chew really well, take small bites, always seek medical attention if any concern for difficulty breathing.

## 2016-08-21 NOTE — Telephone Encounter (Signed)
Tried to call pt, no answer, LMOVM for her to call office. 

## 2016-08-21 NOTE — Telephone Encounter (Signed)
Pt called office. Informed her of AB's recommendation. She is ok with having BPE done. She would like to have it done this week if possible, if not this week then the week of 09/03/16 d/t family from out of town coming in next week.  BPE scheduled for 08/23/16 at 8:30am, pt to arrive at 8:15am. NPO 3 hours before test. Tried to call pt to inform her, LMOVM and informed of appt.

## 2016-08-22 NOTE — Telephone Encounter (Signed)
noted 

## 2016-08-23 ENCOUNTER — Ambulatory Visit (HOSPITAL_COMMUNITY)
Admission: RE | Admit: 2016-08-23 | Discharge: 2016-08-23 | Disposition: A | Discharge status: Home / Self Care | Payer: Medicare Other | Source: Ambulatory Visit | Attending: Gastroenterology | Admitting: Gastroenterology

## 2016-08-23 DIAGNOSIS — R0989 Other specified symptoms and signs involving the circulatory and respiratory systems: Secondary | ICD-10-CM | POA: Diagnosis not present

## 2016-08-23 DIAGNOSIS — J392 Other diseases of pharynx: Secondary | ICD-10-CM | POA: Diagnosis not present

## 2016-08-23 DIAGNOSIS — K219 Gastro-esophageal reflux disease without esophagitis: Secondary | ICD-10-CM

## 2016-08-23 DIAGNOSIS — Z8379 Family history of other diseases of the digestive system: Secondary | ICD-10-CM | POA: Diagnosis not present

## 2016-08-28 NOTE — Progress Notes (Signed)
She has mild age-related dysmotility and a prominent cricopharyngeus muscle. Would chew well, take small bites, sit upright while eating. Recent EGD on file.

## 2016-09-13 ENCOUNTER — Ambulatory Visit (HOSPITAL_COMMUNITY)
Admission: RE | Admit: 2016-09-13 | Discharge: 2016-09-13 | Disposition: A | Discharge status: Home / Self Care | Payer: Medicare Other | Source: Ambulatory Visit | Attending: Internal Medicine | Admitting: Internal Medicine

## 2016-09-13 DIAGNOSIS — Z1231 Encounter for screening mammogram for malignant neoplasm of breast: Secondary | ICD-10-CM | POA: Diagnosis not present

## 2016-10-08 DIAGNOSIS — M329 Systemic lupus erythematosus, unspecified: Secondary | ICD-10-CM | POA: Diagnosis not present

## 2016-10-08 DIAGNOSIS — Z299 Encounter for prophylactic measures, unspecified: Secondary | ICD-10-CM | POA: Diagnosis not present

## 2016-10-08 DIAGNOSIS — Z6821 Body mass index (BMI) 21.0-21.9, adult: Secondary | ICD-10-CM | POA: Diagnosis not present

## 2016-10-08 DIAGNOSIS — G4700 Insomnia, unspecified: Secondary | ICD-10-CM | POA: Diagnosis not present

## 2016-10-08 DIAGNOSIS — E7800 Pure hypercholesterolemia, unspecified: Secondary | ICD-10-CM | POA: Diagnosis not present

## 2016-11-15 DIAGNOSIS — Z299 Encounter for prophylactic measures, unspecified: Secondary | ICD-10-CM | POA: Diagnosis not present

## 2016-11-15 DIAGNOSIS — Z79899 Other long term (current) drug therapy: Secondary | ICD-10-CM | POA: Diagnosis not present

## 2016-11-15 DIAGNOSIS — M545 Low back pain: Secondary | ICD-10-CM | POA: Diagnosis not present

## 2016-11-15 DIAGNOSIS — R5383 Other fatigue: Secondary | ICD-10-CM | POA: Diagnosis not present

## 2016-11-15 DIAGNOSIS — Z0000 Encounter for general adult medical examination without abnormal findings: Secondary | ICD-10-CM | POA: Diagnosis not present

## 2016-11-15 DIAGNOSIS — G43909 Migraine, unspecified, not intractable, without status migrainosus: Secondary | ICD-10-CM | POA: Diagnosis not present

## 2016-11-15 DIAGNOSIS — E559 Vitamin D deficiency, unspecified: Secondary | ICD-10-CM | POA: Diagnosis not present

## 2016-11-15 DIAGNOSIS — Z1389 Encounter for screening for other disorder: Secondary | ICD-10-CM | POA: Diagnosis not present

## 2016-11-15 DIAGNOSIS — Z6821 Body mass index (BMI) 21.0-21.9, adult: Secondary | ICD-10-CM | POA: Diagnosis not present

## 2016-11-15 DIAGNOSIS — M329 Systemic lupus erythematosus, unspecified: Secondary | ICD-10-CM | POA: Diagnosis not present

## 2016-11-15 DIAGNOSIS — E7800 Pure hypercholesterolemia, unspecified: Secondary | ICD-10-CM | POA: Diagnosis not present

## 2016-11-15 DIAGNOSIS — Z7189 Other specified counseling: Secondary | ICD-10-CM | POA: Diagnosis not present

## 2016-11-15 DIAGNOSIS — M8580 Other specified disorders of bone density and structure, unspecified site: Secondary | ICD-10-CM | POA: Diagnosis not present

## 2016-12-21 ENCOUNTER — Other Ambulatory Visit: Payer: Self-pay | Admitting: Internal Medicine

## 2016-12-26 NOTE — Progress Notes (Signed)
Office Visit Note  Patient: Sheila Oliver             Date of Birth: 05-Sep-1940           MRN: [id number]             PCP: Monico Blitz, MD Referring: Monico Blitz, MD Visit Date: 01/08/2017 Occupation: @GUAROCC @    Subjective:  Left hip pain.   History of Present Illness: Sheila Oliver is a 76 y.o. female with history of osteoarthritis and disc disease. She states recently she's been having a lot of pain and discomfort in her left hip and lower back. She describes pain over the left trochanteric area. She states about 3 weeks ago the pain was severe and it eased off. She continues to have lower back discomfort and has been seeing Dr. Melven Sartorius. She has had some nerve ablation and epidural injections.  Activities of Daily Living:  Patient reports morning stiffness for 1 minute.   Patient Reports nocturnal pain.  Difficulty dressing/grooming: Denies Difficulty climbing stairs: Denies Difficulty getting out of chair: Denies Difficulty using hands for taps, buttons, cutlery, and/or writing: Denies   Review of Systems  Constitutional: Negative.  Negative for fatigue, night sweats, weight gain, weight loss and weakness.  HENT: Negative.  Negative for mouth sores, trouble swallowing, trouble swallowing, mouth dryness and nose dryness.   Eyes: Positive for dryness. Negative for pain, redness and visual disturbance.  Respiratory: Negative for cough, shortness of breath and difficulty breathing.   Cardiovascular: Negative.  Negative for chest pain, palpitations, hypertension, irregular heartbeat and swelling in legs/feet.  Gastrointestinal: Negative for blood in stool, constipation and diarrhea.  Endocrine: Negative for increased urination.  Genitourinary: Negative for vaginal dryness.  Musculoskeletal: Positive for arthralgias, joint pain and joint swelling. Negative for myalgias, muscle weakness, morning stiffness, muscle tenderness and myalgias.  Skin: Positive for hair loss. Negative  for color change, rash, skin tightness, ulcers and sensitivity to sunlight.  Allergic/Immunologic: Negative for susceptible to infections.  Neurological: Negative for dizziness, numbness, headaches, memory loss and night sweats.  Hematological: Negative for swollen glands.  Psychiatric/Behavioral: Negative.  Negative for depressed mood and sleep disturbance. The patient is not nervous/anxious.     PMFS History:  Patient Active Problem List   Diagnosis Date Noted  . Chronic diarrhea 06/20/2016  . Osteopenia of multiple sites 05/15/2016  . Chronic pain syndrome 05/15/2016  . Primary insomnia 05/15/2016  . History of gastroesophageal reflux (GERD) 05/15/2016  . ANA positive 05/07/2016  . DJD (degenerative joint disease), cervical 05/07/2016  . Spondylosis of lumbar region without myelopathy or radiculopathy 05/07/2016  . Primary osteoarthritis of both hands 05/07/2016    Past Medical History:  Diagnosis Date  . DDD (degenerative disc disease), lumbar   . GERD (gastroesophageal reflux disease)   . Osteoporosis     No family history on file. Past Surgical History:  Procedure Laterality Date  . CATARACT EXTRACTION Left   . CERVICAL FUSION    . COLONOSCOPY WITH PROPOFOL N/A 05/31/2016   Procedure: COLONOSCOPY WITH PROPOFOL;  Surgeon: Daneil Dolin, MD;  Location: [facility];  Service: Endoscopy;  Laterality: N/A;  930   . ESOPHAGOGASTRODUODENOSCOPY (EGD) WITH PROPOFOL N/A 05/31/2016   Procedure: ESOPHAGOGASTRODUODENOSCOPY (EGD) WITH PROPOFOL;  Surgeon: Daneil Dolin, MD;  Location: [facility];  Service: Endoscopy;  Laterality: N/A;  . repair of rectal fissure    . SUPERFICIAL KERATECTOMY Bilateral    Social History   Social History Narrative  .  No narrative on file     Objective: Vital Signs: BP 135/75 (BP Location: Left Arm, Patient Position: Sitting, Cuff Size: Normal)   Pulse 66   Ht 5\' 5"  (1.651 m)   Wt 126 lb (57.2 kg)   BMI 20.97 kg/m    Physical Exam    Constitutional: She is oriented to person, place, and time. She appears well-developed and well-nourished.  HENT:  Head: Normocephalic and atraumatic.  Eyes: Conjunctivae and EOM are normal.  Neck: Normal range of motion.  Cardiovascular: Normal rate, regular rhythm, normal heart sounds and intact distal pulses.   Pulmonary/Chest: Effort normal and breath sounds normal.  Abdominal: Soft. Bowel sounds are normal.  Lymphadenopathy:    She has no cervical adenopathy.  Neurological: She is alert and oriented to person, place, and time.  Skin: Skin is warm and dry. Capillary refill takes less than 2 seconds.  Psychiatric: She has a normal mood and affect. Her behavior is normal.  Nursing note and vitals reviewed.    Musculoskeletal Exam: C-spine and thoracic lumbar spine limited range of motion with discomfort. She also has lumbar scoliosis. Shoulder joints elbow joints wrist joint MCPs PIPs DIPs were good range of motion. She has PIP/DIP thickening in her hands consistent with osteoarthritis. She has tenderness on palpation over bilateral trochanteric bursa more so on the left side. These findings were consistent with trochanteric bursitis. Knee joints ankles MTPs with good range of motion with no synovitis.  CDAI Exam: No CDAI exam completed.    Investigation: No additional findings. CBC Latest Ref Rng & Units 06/25/2016 05/24/2016  WBC 3.4 - 10.8 x10E3/uL 6.6 4.4  Hemoglobin 11.1 - 15.9 g/dL 14.0 14.3  Hematocrit 34.0 - 46.6 % 42.0 42.1  Platelets 150 - 379 x10E3/uL 219 179   CMP Latest Ref Rng & Units 06/25/2016 05/24/2016  Glucose 65 - 99 mg/dL 96 77  BUN 8 - 27 mg/dL 18 18  Creatinine 0.57 - 1.00 mg/dL 1.00 0.99  Sodium 134 - 144 mmol/L 141 139  Potassium 3.5 - 5.2 mmol/L 4.2 4.0  Chloride 96 - 106 mmol/L 99 105  CO2 18 - 29 mmol/L 26 28  Calcium 8.7 - 10.3 mg/dL 9.3 9.1  Total Protein 6.0 - 8.5 g/dL 6.6 -  Total Bilirubin 0.0 - 1.2 mg/dL 1.4(H) -  Alkaline Phos 39 - 117 IU/L 66 -   AST 0 - 40 IU/L 11 -  ALT 0 - 32 IU/L 15 -    Imaging: No results found.  Speciality Comments: No specialty comments available.    Procedures:  No procedures performed Allergies: Cortisone   Assessment / Plan:     Visit Diagnoses: ANA positive - Positive ANA, positive double-stranded DNA, positive beta-2. Patient has no clinical features of autoimmune disease on exam today. Patient declined blood work today.  Trochanteric bursitis of left hip: She's been having a lot of pain and discomfort. I offered cortisone injection which she declined. Have given her ITB and exercises.  Primary osteoarthritis of both hands: Joint protection and muscle strengthening was discussed.  DDD (degenerative disc disease), cervical - Status post discectomy  DDD (degenerative disc disease), lumbar - With the scoliosis. Her lower back pain is improved after epidural. She continues to follow with pain management.  Hair loss: I've advised her to schedule an appointment with dermatologist.  Osteopenia of multiple sites: She is on calcium and vitamin D.  Her other medical problems are listed as follows:  Chronic pain syndrome  Primary insomnia  History of gastroesophageal reflux (GERD)  History of diarrhea    Orders: No orders of the defined types were placed in this encounter.  No orders of the defined types were placed in this encounter.   .  Follow-Up Instructions: Return in about 1 year (around 01/08/2018) for Osteoarthritis.   Bo Merino, MD  Note - This record has been created using Editor, commissioning.  Chart creation errors have been sought, but may not always  have been located. Such creation errors do not reflect on  the standard of medical care.

## 2017-01-03 DIAGNOSIS — Z6821 Body mass index (BMI) 21.0-21.9, adult: Secondary | ICD-10-CM | POA: Diagnosis not present

## 2017-01-03 DIAGNOSIS — J069 Acute upper respiratory infection, unspecified: Secondary | ICD-10-CM | POA: Diagnosis not present

## 2017-01-03 DIAGNOSIS — Z713 Dietary counseling and surveillance: Secondary | ICD-10-CM | POA: Diagnosis not present

## 2017-01-03 DIAGNOSIS — Z299 Encounter for prophylactic measures, unspecified: Secondary | ICD-10-CM | POA: Diagnosis not present

## 2017-01-03 DIAGNOSIS — I10 Essential (primary) hypertension: Secondary | ICD-10-CM | POA: Diagnosis not present

## 2017-01-08 ENCOUNTER — Encounter: Payer: Self-pay | Admitting: Rheumatology

## 2017-01-08 ENCOUNTER — Ambulatory Visit (INDEPENDENT_AMBULATORY_CARE_PROVIDER_SITE_OTHER): Payer: Medicare Other | Admitting: Rheumatology

## 2017-01-08 VITALS — BP 135/75 | HR 66 | Ht 65.0 in | Wt 126.0 lb

## 2017-01-08 DIAGNOSIS — Z87898 Personal history of other specified conditions: Secondary | ICD-10-CM

## 2017-01-08 DIAGNOSIS — Z8719 Personal history of other diseases of the digestive system: Secondary | ICD-10-CM

## 2017-01-08 DIAGNOSIS — L659 Nonscarring hair loss, unspecified: Secondary | ICD-10-CM | POA: Diagnosis not present

## 2017-01-08 DIAGNOSIS — M19042 Primary osteoarthritis, left hand: Secondary | ICD-10-CM | POA: Diagnosis not present

## 2017-01-08 DIAGNOSIS — M5136 Other intervertebral disc degeneration, lumbar region: Secondary | ICD-10-CM | POA: Diagnosis not present

## 2017-01-08 DIAGNOSIS — G894 Chronic pain syndrome: Secondary | ICD-10-CM

## 2017-01-08 DIAGNOSIS — F5101 Primary insomnia: Secondary | ICD-10-CM

## 2017-01-08 DIAGNOSIS — M7062 Trochanteric bursitis, left hip: Secondary | ICD-10-CM | POA: Diagnosis not present

## 2017-01-08 DIAGNOSIS — R768 Other specified abnormal immunological findings in serum: Secondary | ICD-10-CM

## 2017-01-08 DIAGNOSIS — M19041 Primary osteoarthritis, right hand: Secondary | ICD-10-CM | POA: Diagnosis not present

## 2017-01-08 DIAGNOSIS — M8589 Other specified disorders of bone density and structure, multiple sites: Secondary | ICD-10-CM

## 2017-01-08 DIAGNOSIS — M5030 Other cervical disc degeneration, unspecified cervical region: Secondary | ICD-10-CM | POA: Diagnosis not present

## 2017-01-08 NOTE — Patient Instructions (Signed)
Iliotibial Band Syndrome Rehab  Ask your health care provider which exercises are safe for you. Do exercises exactly as told by your health care provider and adjust them as directed. It is normal to feel mild stretching, pulling, tightness, or discomfort as you do these exercises, but you should stop right away if you feel sudden pain or your pain gets worse. Do not begin these exercises until told by your health care provider.  Stretching and range of motion exercises  These exercises warm up your muscles and joints and improve the movement and flexibility of your hip and pelvis.  Exercise A: Quadriceps, prone    1. Lie on your abdomen on a firm surface, such as a bed or padded floor.  2. Bend your left / right knee and hold your ankle. If you cannot reach your ankle or pant leg, loop a belt around your foot and grab the belt instead.  3. Gently pull your heel toward your buttocks. Your knee should not slide out to the side. You should feel a stretch in the front of your thigh and knee.  4. Hold this position for __________ seconds.  Repeat __________ times. Complete this stretch __________ times a day.  Exercise B: Iliotibial band    1. Lie on your side with your left / right leg in the top position.  2. Bend both of your knees and grab your left / right ankle. Stretch out your bottom arm to help you balance.  3. Slowly bring your top knee back so your thigh goes behind your trunk.  4. Slowly lower your top leg toward the floor until you feel a gentle stretch on the outside of your left / right hip and thigh. If you do not feel a stretch and your knee will not fall farther, place the heel of your other foot on top of your knee and pull your knee down toward the floor with your foot.  5. Hold this position for __________ seconds.  Repeat __________ times. Complete this stretch __________ times a day.  Strengthening exercises  These exercises build strength and endurance in your hip and pelvis. Endurance is the  ability to use your muscles for a long time, even after they get tired.  Exercise C: Straight leg raises (  hip abductors)  1. Lie on your side with your left / right leg in the top position. Lie so your head, shoulder, knee, and hip line up. You may bend your bottom knee to help you balance.  2. Roll your hips slightly forward so your hips are stacked directly over each other and your left / right knee is facing forward.  3. Tense the muscles in your outer thigh and lift your top leg 4-6 inches (10-15 cm).  4. Hold this position for __________ seconds.  5. Slowly return to the starting position. Let your muscles relax completely before doing another repetition.  Repeat __________ times. Complete this exercise __________ times a day.  Exercise D: Straight leg raises (  hip extensors)  1. Lie on your abdomen on your bed or a firm surface. You can put a pillow under your hips if that is more comfortable.  2. Bend your left / right knee so your foot is straight up in the air.  3. Squeeze your buttock muscles and lift your left / right thigh off the bed. Do not let your back arch.  4. Tense this muscle as hard as you can without increasing any knee pain.    5. Hold this position for __________ seconds.  6. Slowly lower your leg to the starting position and allow it to relax completely.  Repeat __________ times. Complete this exercise __________ times a day.  Exercise E: Hip hike  1. Stand sideways on a bottom step. Stand on your left / right leg with your other foot unsupported next to the step. You can hold onto the railing or wall if needed for balance.  2. Keep your knees straight and your torso square. Then, lift your left / right hip up toward the ceiling.  3. Slowly let your left / right hip lower toward the floor, past the starting position. Your foot should get closer to the floor. Do not lean or bend your knees.  Repeat __________ times. Complete this exercise __________ times a day.  This information is not  intended to replace advice given to you by your health care provider. Make sure you discuss any questions you have with your health care provider.  Document Released: 03/05/2005 Document Revised: 11/08/2015 Document Reviewed: 02/04/2015  Elsevier Interactive Patient Education © 2018 [employer]vier [employer]

## 2017-01-24 DIAGNOSIS — M4120 Other idiopathic scoliosis, site unspecified: Secondary | ICD-10-CM | POA: Diagnosis not present

## 2017-01-24 DIAGNOSIS — M545 Low back pain: Secondary | ICD-10-CM | POA: Diagnosis not present

## 2017-01-24 DIAGNOSIS — M47816 Spondylosis without myelopathy or radiculopathy, lumbar region: Secondary | ICD-10-CM | POA: Diagnosis not present

## 2017-01-24 DIAGNOSIS — G894 Chronic pain syndrome: Secondary | ICD-10-CM | POA: Diagnosis not present

## 2017-01-24 DIAGNOSIS — M5136 Other intervertebral disc degeneration, lumbar region: Secondary | ICD-10-CM | POA: Diagnosis not present

## 2017-01-24 DIAGNOSIS — Z79891 Long term (current) use of opiate analgesic: Secondary | ICD-10-CM | POA: Diagnosis not present

## 2017-02-04 ENCOUNTER — Telehealth: Payer: Self-pay | Admitting: Internal Medicine

## 2017-02-04 NOTE — Telephone Encounter (Signed)
Pt called to ask if RMR treats the problem she is having or does she need to go to another type of doctor. She said her tongue has been burning for 2 months and it's sore all the time. Please advise and call  [phone]

## 2017-02-04 NOTE — Telephone Encounter (Signed)
Pt asked to start with her pcp for burning tongue sensations.

## 2017-02-14 DIAGNOSIS — Z6821 Body mass index (BMI) 21.0-21.9, adult: Secondary | ICD-10-CM | POA: Diagnosis not present

## 2017-02-14 DIAGNOSIS — E7800 Pure hypercholesterolemia, unspecified: Secondary | ICD-10-CM | POA: Diagnosis not present

## 2017-02-14 DIAGNOSIS — Z789 Other specified health status: Secondary | ICD-10-CM | POA: Diagnosis not present

## 2017-02-14 DIAGNOSIS — I10 Essential (primary) hypertension: Secondary | ICD-10-CM | POA: Diagnosis not present

## 2017-02-14 DIAGNOSIS — Z2821 Immunization not carried out because of patient refusal: Secondary | ICD-10-CM | POA: Diagnosis not present

## 2017-02-14 DIAGNOSIS — J069 Acute upper respiratory infection, unspecified: Secondary | ICD-10-CM | POA: Diagnosis not present

## 2017-02-14 DIAGNOSIS — K219 Gastro-esophageal reflux disease without esophagitis: Secondary | ICD-10-CM | POA: Diagnosis not present

## 2017-02-14 DIAGNOSIS — Z299 Encounter for prophylactic measures, unspecified: Secondary | ICD-10-CM | POA: Diagnosis not present

## 2017-02-14 DIAGNOSIS — K146 Glossodynia: Secondary | ICD-10-CM | POA: Diagnosis not present

## 2017-02-22 DIAGNOSIS — K219 Gastro-esophageal reflux disease without esophagitis: Secondary | ICD-10-CM | POA: Diagnosis not present

## 2017-02-22 DIAGNOSIS — Z789 Other specified health status: Secondary | ICD-10-CM | POA: Diagnosis not present

## 2017-02-22 DIAGNOSIS — Z299 Encounter for prophylactic measures, unspecified: Secondary | ICD-10-CM | POA: Diagnosis not present

## 2017-02-22 DIAGNOSIS — R05 Cough: Secondary | ICD-10-CM | POA: Diagnosis not present

## 2017-02-22 DIAGNOSIS — Z6821 Body mass index (BMI) 21.0-21.9, adult: Secondary | ICD-10-CM | POA: Diagnosis not present

## 2017-03-20 ENCOUNTER — Telehealth: Payer: Self-pay | Admitting: Internal Medicine

## 2017-03-20 NOTE — Telephone Encounter (Signed)
[phone] please call patient, she needs something less expensive called in for her reflux, her insurance is not paying for what she is on now.

## 2017-03-20 NOTE — Telephone Encounter (Signed)
Spoke with pt and her Robeprazole (Aciphex) is $40.00. Pt wants to pay less for the medication monthly. Researched on GoodRx medication is $10.00 at [facility] and around $24-$25.00 at other local pharmacies in [place]. Pts spouse is a member at [facility] and pt said she will hold off on getting rx at another pharmacy. Pt is aware that these options are available if needed. Pt will call back when ready.

## 2017-05-06 ENCOUNTER — Telehealth: Payer: Self-pay

## 2017-05-06 NOTE — Telephone Encounter (Signed)
Pt called office. She would like something else for reflux. Her insurance isn't paying for Rabeprazole. [facility] [place]. Offered OV since she hasn't been seen since 07/2016. She declined OV d/t her husband has been sick and she's having to take care of him. States right now isn't a good time.  Routing to LSL in RMR's absence.

## 2017-05-07 MED ORDER — PANTOPRAZOLE SODIUM 40 MG PO TBEC: 40.0000 mg | DELAYED_RELEASE_TABLET | Freq: Every day | ORAL | 5 refills | Status: DC

## 2017-05-07 NOTE — Telephone Encounter (Signed)
Called and informed pt.  

## 2017-05-07 NOTE — Telephone Encounter (Signed)
rx sent for pantoprazole once daily. Let us know if not covered by insurance.

## 2017-05-24 DIAGNOSIS — Z299 Encounter for prophylactic measures, unspecified: Secondary | ICD-10-CM | POA: Diagnosis not present

## 2017-05-24 DIAGNOSIS — M329 Systemic lupus erythematosus, unspecified: Secondary | ICD-10-CM | POA: Diagnosis not present

## 2017-05-24 DIAGNOSIS — Z6821 Body mass index (BMI) 21.0-21.9, adult: Secondary | ICD-10-CM | POA: Diagnosis not present

## 2017-05-24 DIAGNOSIS — Z789 Other specified health status: Secondary | ICD-10-CM | POA: Diagnosis not present

## 2017-05-24 DIAGNOSIS — E7800 Pure hypercholesterolemia, unspecified: Secondary | ICD-10-CM | POA: Diagnosis not present

## 2017-05-24 DIAGNOSIS — I10 Essential (primary) hypertension: Secondary | ICD-10-CM | POA: Diagnosis not present

## 2017-06-12 DIAGNOSIS — M47816 Spondylosis without myelopathy or radiculopathy, lumbar region: Secondary | ICD-10-CM | POA: Diagnosis not present

## 2017-06-12 DIAGNOSIS — M545 Low back pain: Secondary | ICD-10-CM | POA: Diagnosis not present

## 2017-06-12 DIAGNOSIS — M5136 Other intervertebral disc degeneration, lumbar region: Secondary | ICD-10-CM | POA: Diagnosis not present

## 2017-06-12 DIAGNOSIS — Z79891 Long term (current) use of opiate analgesic: Secondary | ICD-10-CM | POA: Diagnosis not present

## 2017-06-12 DIAGNOSIS — G894 Chronic pain syndrome: Secondary | ICD-10-CM | POA: Diagnosis not present

## 2017-06-12 DIAGNOSIS — M4120 Other idiopathic scoliosis, site unspecified: Secondary | ICD-10-CM | POA: Diagnosis not present

## 2017-06-27 DIAGNOSIS — M47816 Spondylosis without myelopathy or radiculopathy, lumbar region: Secondary | ICD-10-CM | POA: Diagnosis not present

## 2017-06-27 DIAGNOSIS — M5136 Other intervertebral disc degeneration, lumbar region: Secondary | ICD-10-CM | POA: Diagnosis not present

## 2017-07-03 DIAGNOSIS — Z961 Presence of intraocular lens: Secondary | ICD-10-CM | POA: Diagnosis not present

## 2017-07-03 DIAGNOSIS — H25811 Combined forms of age-related cataract, right eye: Secondary | ICD-10-CM | POA: Diagnosis not present

## 2017-07-03 DIAGNOSIS — Z947 Corneal transplant status: Secondary | ICD-10-CM | POA: Diagnosis not present

## 2017-07-03 DIAGNOSIS — Z9889 Other specified postprocedural states: Secondary | ICD-10-CM | POA: Diagnosis not present

## 2017-07-03 DIAGNOSIS — H2513 Age-related nuclear cataract, bilateral: Secondary | ICD-10-CM | POA: Diagnosis not present

## 2017-07-03 DIAGNOSIS — Z9849 Cataract extraction status, unspecified eye: Secondary | ICD-10-CM | POA: Diagnosis not present

## 2017-07-25 DIAGNOSIS — M47816 Spondylosis without myelopathy or radiculopathy, lumbar region: Secondary | ICD-10-CM | POA: Diagnosis not present

## 2017-07-25 DIAGNOSIS — M5136 Other intervertebral disc degeneration, lumbar region: Secondary | ICD-10-CM | POA: Diagnosis not present

## 2017-07-25 DIAGNOSIS — G894 Chronic pain syndrome: Secondary | ICD-10-CM | POA: Diagnosis not present

## 2017-07-25 DIAGNOSIS — Z79891 Long term (current) use of opiate analgesic: Secondary | ICD-10-CM | POA: Diagnosis not present

## 2017-07-25 DIAGNOSIS — M545 Low back pain: Secondary | ICD-10-CM | POA: Diagnosis not present

## 2017-08-20 DIAGNOSIS — K219 Gastro-esophageal reflux disease without esophagitis: Secondary | ICD-10-CM | POA: Diagnosis not present

## 2017-08-20 DIAGNOSIS — H25811 Combined forms of age-related cataract, right eye: Secondary | ICD-10-CM | POA: Diagnosis not present

## 2017-08-20 DIAGNOSIS — G43909 Migraine, unspecified, not intractable, without status migrainosus: Secondary | ICD-10-CM | POA: Diagnosis not present

## 2017-08-20 DIAGNOSIS — F419 Anxiety disorder, unspecified: Secondary | ICD-10-CM | POA: Diagnosis not present

## 2017-08-21 DIAGNOSIS — Z9841 Cataract extraction status, right eye: Secondary | ICD-10-CM | POA: Diagnosis not present

## 2017-08-21 DIAGNOSIS — Z9842 Cataract extraction status, left eye: Secondary | ICD-10-CM | POA: Diagnosis not present

## 2017-08-21 DIAGNOSIS — Z9889 Other specified postprocedural states: Secondary | ICD-10-CM | POA: Diagnosis not present

## 2017-08-21 DIAGNOSIS — Z961 Presence of intraocular lens: Secondary | ICD-10-CM | POA: Diagnosis not present

## 2017-08-21 DIAGNOSIS — Z48810 Encounter for surgical aftercare following surgery on the sense organs: Secondary | ICD-10-CM | POA: Diagnosis not present

## 2017-08-21 DIAGNOSIS — H353131 Nonexudative age-related macular degeneration, bilateral, early dry stage: Secondary | ICD-10-CM | POA: Diagnosis not present

## 2017-08-23 DIAGNOSIS — Z6821 Body mass index (BMI) 21.0-21.9, adult: Secondary | ICD-10-CM | POA: Diagnosis not present

## 2017-08-23 DIAGNOSIS — Z299 Encounter for prophylactic measures, unspecified: Secondary | ICD-10-CM | POA: Diagnosis not present

## 2017-08-23 DIAGNOSIS — Z789 Other specified health status: Secondary | ICD-10-CM | POA: Diagnosis not present

## 2017-08-23 DIAGNOSIS — M329 Systemic lupus erythematosus, unspecified: Secondary | ICD-10-CM | POA: Diagnosis not present

## 2017-08-23 DIAGNOSIS — I10 Essential (primary) hypertension: Secondary | ICD-10-CM | POA: Diagnosis not present

## 2017-09-02 ENCOUNTER — Other Ambulatory Visit (HOSPITAL_COMMUNITY): Payer: Self-pay | Admitting: Internal Medicine

## 2017-09-02 DIAGNOSIS — Z1231 Encounter for screening mammogram for malignant neoplasm of breast: Secondary | ICD-10-CM

## 2017-09-04 ENCOUNTER — Ambulatory Visit (INDEPENDENT_AMBULATORY_CARE_PROVIDER_SITE_OTHER): Payer: Medicare Other | Admitting: Gastroenterology

## 2017-09-04 ENCOUNTER — Encounter: Payer: Self-pay | Admitting: Gastroenterology

## 2017-09-04 DIAGNOSIS — K644 Residual hemorrhoidal skin tags: Secondary | ICD-10-CM | POA: Diagnosis not present

## 2017-09-04 MED ORDER — HYDROCORTISONE 2.5 % RE CREA: TOPICAL_CREAM | RECTAL | 1 refills | Status: DC

## 2017-09-04 NOTE — Patient Instructions (Addendum)
I called in the prescription for the cream to [facility] They will be calling you this afternoon to update everything and deliver between 1030 and 12 tomorrow.   Make sure you wear gloves while applying this cream, as the nitroglycerin can cause headaches and dizziness. Wash hands thereafter.   Call if things do not improve! I have included some other ideas to help with symptoms.  We will see you in 3 months!  It was a pleasure to see you today. I strive to create trusting relationships with patients to provide genuine, compassionate, and quality care. I value your feedback. If you receive a survey regarding your visit,  I greatly appreciate you taking time to fill this out.   Annitta Needs, PhD, ANP-BC [facility]    Hemorrhoids Hemorrhoids are swollen veins in and around the rectum or anus. There are two types of hemorrhoids:  Internal hemorrhoids. These occur in the veins that are just inside the rectum. They may poke through to the outside and become irritated and painful.  External hemorrhoids. These occur in the veins that are outside of the anus and can be felt as a painful swelling or hard lump near the anus.  Most hemorrhoids do not cause serious problems, and they can be managed with home treatments such as diet and lifestyle changes. If home treatments do not help your symptoms, procedures can be done to shrink or remove the hemorrhoids. What are the causes? This condition is caused by increased pressure in the anal area. This pressure may result from various things, including:  Constipation.  Straining to have a bowel movement.  Diarrhea.  Pregnancy.  Obesity.  Sitting for long periods of time.  Heavy lifting or other activity that causes you to strain.  Anal sex.  What are the signs or symptoms? Symptoms of this condition include:  Pain.  Anal itching or irritation.  Rectal bleeding.  Leakage of stool (feces).  Anal  swelling.  One or more lumps around the anus.  How is this diagnosed? This condition can often be diagnosed through a visual exam. Other exams or tests may also be done, such as:  Examination of the rectal area with a gloved hand (digital rectal exam).  Examination of the anal canal using a small tube (anoscope).  A blood test, if you have lost a significant amount of blood.  A test to look inside the colon (sigmoidoscopy or colonoscopy).  How is this treated? This condition can usually be treated at home. However, various procedures may be done if dietary changes, lifestyle changes, and other home treatments do not help your symptoms. These procedures can help make the hemorrhoids smaller or remove them completely. Some of these procedures involve surgery, and others do not. Common procedures include:  Rubber band ligation. Rubber bands are placed at the base of the hemorrhoids to cut off the blood supply to them.  Sclerotherapy. Medicine is injected into the hemorrhoids to shrink them.  Infrared coagulation. A type of light energy is used to get rid of the hemorrhoids.  Hemorrhoidectomy surgery. The hemorrhoids are surgically removed, and the veins that supply them are tied off.  Stapled hemorrhoidopexy surgery. A circular stapling device is used to remove the hemorrhoids and use staples to cut off the blood supply to them.  Follow these instructions at home: Eating and drinking  Eat foods that have a lot of fiber in them, such as whole grains, beans, nuts, fruits, and vegetables. Ask your health care  provider about taking products that have added fiber (fiber supplements).  Drink enough fluid to keep your urine clear or pale yellow. Managing pain and swelling  Take warm sitz baths for 20 minutes, 3-4 times a day to ease pain and discomfort.  If directed, apply ice to the affected area. Using ice packs between sitz baths may be helpful. ? Put ice in a plastic bag. ? Place a  towel between your skin and the bag. ? Leave the ice on for 20 minutes, 2-3 times a day. General instructions  Take over-the-counter and prescription medicines only as told by your health care provider.  Use medicated creams or suppositories as told.  Exercise regularly.  Go to the bathroom when you have the urge to have a bowel movement. Do not wait.  Avoid straining to have bowel movements.  Keep the anal area dry and clean. Use wet toilet paper or moist towelettes after a bowel movement.  Do not sit on the toilet for long periods of time. This increases blood pooling and pain. Contact a health care provider if:  You have increasing pain and swelling that are not controlled by treatment or medicine.  You have uncontrolled bleeding.  You have difficulty having a bowel movement, or you are unable to have a bowel movement.  You have pain or inflammation outside the area of the hemorrhoids. This information is not intended to replace advice given to you by your health care provider. Make sure you discuss any questions you have with your health care provider. Document Released: 03/02/2000 Document Revised: 08/03/2015 Document Reviewed: 11/17/2014 Elsevier Interactive Patient Education  [employer].

## 2017-09-04 NOTE — Assessment & Plan Note (Signed)
External enlarged hemorrhoid with symptoms present for several weeks. Tender to touch but no obvious thrombosis. No obvious fissure but query occult fissure due to symptoms. I have sent in [place] hemorrhoid cream compounded with lidocaine and nitro. They will deliver to her for free tomorrow morning. Other hemorrhoid care discussed. She is no longer constipated, and we discussed avoidance of straining in future. Call if no improvement and return in 3 months.

## 2017-09-04 NOTE — Progress Notes (Signed)
Primary Care Physician:  Monico Blitz, MD  Primary GI: Dr. Gala Romney   Chief Complaint  Patient presents with  . Hemorrhoids    HPI:   Sheila Oliver is a 77 y.o. female presenting today with a history of chronic diarrhea s/p colonoscopy March 2018 with negative colonic biopsies. Also notable history of esophagitis as documented on EGD March 2018. Cdiff toxin positive last year and treated with vanc and resolution of symptoms. 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   On June 4th had eye surgery. Had subsequent issue with constipation and had to strain. Now has developed hemorrhoids. Has been using preparation H, hydrocortisone cream 2.5%. No rectal bleeding. Notes pain right-side of rectum. When sticking preparation H inside, has discomfort on right side. Has underlying pain in right side of rectum. No pain with BM. Using hydrocortisone cream BID for about a week.   Remote history of anal fissure repair.   Past Medical History:  Diagnosis Date  . DDD (degenerative disc disease), lumbar   . GERD (gastroesophageal reflux disease)   . Osteoporosis     Past Surgical History:  Procedure Laterality Date  . CATARACT EXTRACTION Left   . CERVICAL FUSION    . COLONOSCOPY WITH PROPOFOL N/A 05/31/2016   Dr. Gala Romney: sigmoid and descending colon diverticulosis, normal colonic biopsies  . ESOPHAGOGASTRODUODENOSCOPY (EGD) WITH PROPOFOL N/A 05/31/2016   Dr. Gala Romney: LA Grade A esophagitis, s/p dilation, erosive gastropathy, multiple benign gastric polyps, normal duodenum  . repair of rectal fissure    . SUPERFICIAL KERATECTOMY Bilateral     Current Outpatient Medications  Medication Sig Dispense Refill  . Calcium Carb-Cholecalciferol (CALCIUM 600+D) 600-800 MG-UNIT TABS Take 1 tablet by mouth 2 (two) times daily.    . diclofenac sodium (VOLTAREN) 1 % GEL Apply 1 application topically as needed.     . lactose free  nutrition (BOOST) LIQD Take 237 mLs by mouth every other day.     Marland Kitchen LORazepam (ATIVAN) 0.5 MG tablet Take 0.5 mg by mouth at bedtime.     . Multiple Vitamin (MULTIVITAMIN WITH MINERALS) TABS tablet Take 1 tablet by mouth daily. Women's Centrum Silver 50+    . pantoprazole (PROTONIX) 40 MG tablet Take 1 tablet (40 mg total) by mouth daily before breakfast. 30 tablet 5  . Polyethyl Glycol-Propyl Glycol (SYSTANE ULTRA OP) Apply 1-2 drops to eye 3 (three) times daily as needed (for dry eyes.).    Marland Kitchen Probiotic Product (ALIGN) 4 MG CAPS Take 1 capsule (4 mg total) by mouth daily. 30 capsule 11  . traMADol (ULTRAM) 50 MG tablet Take 50 mg by mouth every 6 (six) hours as needed (for pain).     . traZODone (DESYREL) 50 MG tablet Take 50 mg by mouth at bedtime.     No current facility-administered medications for this visit.     Allergies as of 09/04/2017 - Review Complete 09/04/2017  Allergen Reaction Noted  . Cortisone  05/01/2016      Social History   Socioeconomic History  . Marital status: Widowed    Spouse name: Not on file  . Number of children: Not on file  . Years of education: Not on file  . Highest education level: Not on file  Occupational History  . Not on file  Social Needs  . Financial resource strain: Not on file  . Food insecurity:    Worry: Not on file  Inability: Not on file  . Transportation needs:    Medical: Not on file    Non-medical: Not on file  Tobacco Use  . Smoking status: Never Smoker  . Smokeless tobacco: Never Used  Substance and Sexual Activity  . Alcohol use: No  . Drug use: No  . Sexual activity: Yes    Birth control/protection: Post-menopausal  Lifestyle  . Physical activity:    Days per week: Not on file    Minutes per session: Not on file  . Stress: Not on file  Relationships  . Social connections:    Talks on phone: Not on file    Gets together: Not on file    Attends religious service: Not on file    Active member of club or  organization: Not on file    Attends meetings of clubs or organizations: Not on file    Relationship status: Not on file  Other Topics Concern  . Not on file  Social History Narrative  . Not on file    Review of Systems: As mentioned in HPI   Physical Exam: BP 122/83   Pulse 92   Temp (!) 97.4 F (36.3 C) (Oral)   Ht 5\' 5"  (1.651 m)   Wt 130 lb 6.4 oz (59.1 kg)   BMI 21.70 kg/m  General:   Alert and oriented. No distress noted. Pleasant and cooperative.  Head:  Normocephalic and atraumatic. Eyes:  Conjuctiva clear without scleral icterus. Mouth:  Oral mucosa pink and moist.  Abdomen:  +BS, soft, non-tender and non-distended. No rebound or guarding. No HSM or masses noted. Rectal: external exam without obvious fissure, pea-sized external hemorrhoid enlarged right lateral side, soft, TTP, unable to perform rectal exam due to discomfort  Msk:  Symmetrical without gross deformities. Normal posture. Extremities:  Without edema. Neurologic:  Alert and  oriented x4 Psych:  Alert and cooperative. Normal mood and affect.

## 2017-09-05 NOTE — Progress Notes (Signed)
cc'd to pcp 

## 2017-09-06 ENCOUNTER — Telehealth: Payer: Self-pay | Admitting: Internal Medicine

## 2017-09-06 MED ORDER — HYDROCORTISONE 2.5 % RE CREA: 1.0000 "application " | TOPICAL_CREAM | Freq: Two times a day (BID) | RECTAL | 1 refills | Status: DC

## 2017-09-06 NOTE — Telephone Encounter (Signed)
Spoke with pt, she used the HC/nitro cream compound yesterday which was made by [employer]. Pt applied topical with gloves around 10:40 AM and 3Pm after having a bowel movement. Pt began to experience a deep pain that started at the top of her shoulders down to her hands around 3pm yesterday after applying topical to rectum. Pt got a severe headache with swelling of the veins in her hands and arms. This morning, pt still has swelling of the veins in her lt wrist. Pts rt arm and wrist swelling has reduced/improved. No rash seen or felt on the skin and rectum. Pt states she felt like she wasn't going to make it through the night and her husband asked to take her to the ED and pt declined. Pt d/c topical and isn't going to use it again.

## 2017-09-06 NOTE — Addendum Note (Signed)
Addended by: Annitta Needs on: 09/06/2017 11:42 AM   Modules accepted: Orders

## 2017-09-06 NOTE — Telephone Encounter (Signed)
[phone] PLEASE CALL PATIENT, SHE THINKS SHE IS HAVING AN ALLERGIC REACTINON TO A MEDICATION ANNA PRESCRIBED.

## 2017-09-06 NOTE — Telephone Encounter (Signed)
Noted. Please seek care if does not continue to improve. Headache likely secondary to nitro cream that is compounded in this. Definitely don't take any longer. I have sent in anusol cream to use BID. May use a lidocaine ointment from OTC to mix with this. Use 2-3 times per day. I hope she is doing better symptomatically!

## 2017-09-06 NOTE — Telephone Encounter (Signed)
Pt notified of RX changes and will seek help if needed.

## 2017-09-11 DIAGNOSIS — Z961 Presence of intraocular lens: Secondary | ICD-10-CM | POA: Diagnosis not present

## 2017-09-11 DIAGNOSIS — Z48810 Encounter for surgical aftercare following surgery on the sense organs: Secondary | ICD-10-CM | POA: Diagnosis not present

## 2017-09-11 DIAGNOSIS — Z9841 Cataract extraction status, right eye: Secondary | ICD-10-CM | POA: Diagnosis not present

## 2017-09-11 DIAGNOSIS — H3530 Unspecified macular degeneration: Secondary | ICD-10-CM | POA: Diagnosis not present

## 2017-09-11 DIAGNOSIS — Z9842 Cataract extraction status, left eye: Secondary | ICD-10-CM | POA: Diagnosis not present

## 2017-09-11 DIAGNOSIS — Z9889 Other specified postprocedural states: Secondary | ICD-10-CM | POA: Diagnosis not present

## 2017-09-25 ENCOUNTER — Ambulatory Visit (HOSPITAL_COMMUNITY)
Admission: RE | Admit: 2017-09-25 | Discharge: 2017-09-25 | Disposition: A | Discharge status: Home / Self Care | Payer: Medicare Other | Source: Ambulatory Visit | Attending: Internal Medicine | Admitting: Internal Medicine

## 2017-09-25 DIAGNOSIS — Z1231 Encounter for screening mammogram for malignant neoplasm of breast: Secondary | ICD-10-CM | POA: Diagnosis not present

## 2017-10-16 DIAGNOSIS — Z9842 Cataract extraction status, left eye: Secondary | ICD-10-CM | POA: Diagnosis not present

## 2017-10-16 DIAGNOSIS — Z9889 Other specified postprocedural states: Secondary | ICD-10-CM | POA: Diagnosis not present

## 2017-10-16 DIAGNOSIS — Z961 Presence of intraocular lens: Secondary | ICD-10-CM | POA: Diagnosis not present

## 2017-10-16 DIAGNOSIS — H3530 Unspecified macular degeneration: Secondary | ICD-10-CM | POA: Diagnosis not present

## 2017-10-16 DIAGNOSIS — Z79899 Other long term (current) drug therapy: Secondary | ICD-10-CM | POA: Diagnosis not present

## 2017-10-16 DIAGNOSIS — Z48810 Encounter for surgical aftercare following surgery on the sense organs: Secondary | ICD-10-CM | POA: Diagnosis not present

## 2017-10-16 DIAGNOSIS — Z9841 Cataract extraction status, right eye: Secondary | ICD-10-CM | POA: Diagnosis not present

## 2017-10-16 DIAGNOSIS — Z947 Corneal transplant status: Secondary | ICD-10-CM | POA: Diagnosis not present

## 2017-11-20 DIAGNOSIS — I10 Essential (primary) hypertension: Secondary | ICD-10-CM | POA: Diagnosis not present

## 2017-11-20 DIAGNOSIS — E7800 Pure hypercholesterolemia, unspecified: Secondary | ICD-10-CM | POA: Diagnosis not present

## 2017-11-20 DIAGNOSIS — Z6821 Body mass index (BMI) 21.0-21.9, adult: Secondary | ICD-10-CM | POA: Diagnosis not present

## 2017-11-20 DIAGNOSIS — Z1331 Encounter for screening for depression: Secondary | ICD-10-CM | POA: Diagnosis not present

## 2017-11-20 DIAGNOSIS — E559 Vitamin D deficiency, unspecified: Secondary | ICD-10-CM | POA: Diagnosis not present

## 2017-11-20 DIAGNOSIS — Z7189 Other specified counseling: Secondary | ICD-10-CM | POA: Diagnosis not present

## 2017-11-20 DIAGNOSIS — Z299 Encounter for prophylactic measures, unspecified: Secondary | ICD-10-CM | POA: Diagnosis not present

## 2017-11-20 DIAGNOSIS — R5383 Other fatigue: Secondary | ICD-10-CM | POA: Diagnosis not present

## 2017-11-20 DIAGNOSIS — Z1211 Encounter for screening for malignant neoplasm of colon: Secondary | ICD-10-CM | POA: Diagnosis not present

## 2017-11-20 DIAGNOSIS — Z79899 Other long term (current) drug therapy: Secondary | ICD-10-CM | POA: Diagnosis not present

## 2017-11-20 DIAGNOSIS — Z0000 Encounter for general adult medical examination without abnormal findings: Secondary | ICD-10-CM | POA: Diagnosis not present

## 2017-11-20 DIAGNOSIS — Z1339 Encounter for screening examination for other mental health and behavioral disorders: Secondary | ICD-10-CM | POA: Diagnosis not present

## 2017-11-25 DIAGNOSIS — M545 Low back pain: Secondary | ICD-10-CM | POA: Diagnosis not present

## 2017-11-25 DIAGNOSIS — M47816 Spondylosis without myelopathy or radiculopathy, lumbar region: Secondary | ICD-10-CM | POA: Diagnosis not present

## 2017-11-25 DIAGNOSIS — M5136 Other intervertebral disc degeneration, lumbar region: Secondary | ICD-10-CM | POA: Diagnosis not present

## 2017-11-25 DIAGNOSIS — G894 Chronic pain syndrome: Secondary | ICD-10-CM | POA: Diagnosis not present

## 2017-12-10 ENCOUNTER — Other Ambulatory Visit: Payer: Self-pay | Admitting: Gastroenterology

## 2017-12-12 ENCOUNTER — Ambulatory Visit: Payer: Medicare Other | Admitting: Gastroenterology

## 2017-12-17 ENCOUNTER — Ambulatory Visit (INDEPENDENT_AMBULATORY_CARE_PROVIDER_SITE_OTHER): Payer: Medicare Other | Admitting: Internal Medicine

## 2017-12-17 ENCOUNTER — Other Ambulatory Visit: Payer: Self-pay

## 2017-12-17 ENCOUNTER — Encounter: Payer: Self-pay | Admitting: Internal Medicine

## 2017-12-17 VITALS — BP 118/75 | HR 78 | Temp 97.2°F | Ht 65.0 in | Wt 130.4 lb

## 2017-12-17 DIAGNOSIS — R1314 Dysphagia, pharyngoesophageal phase: Secondary | ICD-10-CM | POA: Diagnosis not present

## 2017-12-17 DIAGNOSIS — R1310 Dysphagia, unspecified: Secondary | ICD-10-CM

## 2017-12-17 DIAGNOSIS — K219 Gastro-esophageal reflux disease without esophagitis: Secondary | ICD-10-CM | POA: Diagnosis not present

## 2017-12-17 NOTE — Patient Instructions (Addendum)
Lets get a speech / swallowing evaluation to asesss oral phase swallowing function  Sinus issues may have more to do with hoarseness / swallowing difficulties than anything else  Continue Protonix 40 mg daily  Further recommendations to follow

## 2017-12-17 NOTE — Progress Notes (Signed)
Primary Care Physician:  Monico Blitz, MD Primary Gastroenterologist:  Dr.   Pre-Procedure History & Physical: HPI:  Sheila Oliver is a 77 y.o. female here for follow-up of diarrhea/hoarseness.  Diarrhea has resolved.  History of C. difficile in the past.  Hoarseness and difficulty initiating swallowing these days.  History of reflux esophagitis seen on 2018 EGD.  BPE earlier this year demonstrated dysmotility prominent cricopharyngeus muscle.  No Zenker's diverticulum, no aspiration, no obstruction.  Patient now complains of difficulty initiating a swallow.  Chews her food well feels that she has to press on her chin to help with food "go down". 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  Has nasal spray which she has not used.   Past Medical History:  Diagnosis Date  . DDD (degenerative disc disease), lumbar   . GERD (gastroesophageal reflux disease)   . Osteoporosis     Past Surgical History:  Procedure Laterality Date  . CATARACT EXTRACTION Left   . CERVICAL FUSION    . COLONOSCOPY WITH PROPOFOL N/A 05/31/2016   Dr. Gala Romney: sigmoid and descending colon diverticulosis, normal colonic biopsies  . ESOPHAGOGASTRODUODENOSCOPY (EGD) WITH PROPOFOL N/A 05/31/2016   Dr. Gala Romney: LA Grade A esophagitis, s/p dilation, erosive gastropathy, multiple benign gastric polyps, normal duodenum  . repair of rectal fissure    . SUPERFICIAL KERATECTOMY Bilateral     Prior to Admission medications   Medication Sig Start Date End Date Taking? Authorizing Provider  Calcium Carb-Cholecalciferol (CALCIUM 600+D) 600-800 MG-UNIT TABS Take 1 tablet by mouth 2 (two) times daily.   Yes [provider]  diclofenac sodium (VOLTAREN) 1 % GEL Apply 1 application topically as needed.    Yes [provider]  lactose free nutrition (BOOST) LIQD Take 237 mLs by mouth as needed.    Yes [provider]  LORazepam (ATIVAN) 0.5 MG tablet Take 0.5 mg by  mouth at bedtime.    Yes [provider]  Multiple Vitamin (MULTIVITAMIN WITH MINERALS) TABS tablet Take 1 tablet by mouth daily. Women's Centrum Silver 50+   Yes [provider]  pantoprazole (PROTONIX) 40 MG tablet TAKE 1 TABLET BY MOUTH ONCE DAILY BEFORE BREAKFAST 12/10/17  Yes Carlis Stable, NP  Polyethyl Glycol-Propyl Glycol (SYSTANE ULTRA OP) Apply 1-2 drops to eye 3 (three) times daily as needed (for dry eyes.).   Yes [provider]  Probiotic Product (ALIGN) 4 MG CAPS Take 1 capsule (4 mg total) by mouth daily. 08/06/16  Yes Shad Ledvina, Cristopher Estimable, MD  traMADol (ULTRAM) 50 MG tablet Take 50 mg by mouth every 6 (six) hours as needed (for pain).    Yes [provider]  traZODone (DESYREL) 50 MG tablet Take 50 mg by mouth at bedtime.   Yes [provider]  hydrocortisone (ANUSOL-HC) 2.5 % rectal cream Place 1 application rectally 2 (two) times daily. Patient not taking: Reported on 12/17/2017 09/06/17   Annitta Needs, NP    Allergies as of 12/17/2017 - Review Complete 12/17/2017  Allergen Reaction Noted  . Cortisone  05/01/2016    No family history on file.  Social History   Socioeconomic History  . Marital status: Widowed    Spouse name: Not on file  . Number of children: Not on file  . Years of education: Not on file  . Highest education level: Not on file  Occupational History  . Not on file  Social Needs  . Financial  resource strain: Not on file  . Food insecurity:    Worry: Not on file    Inability: Not on file  . Transportation needs:    Medical: Not on file    Non-medical: Not on file  Tobacco Use  . Smoking status: Never Smoker  . Smokeless tobacco: Never Used  Substance and Sexual Activity  . Alcohol use: No  . Drug use: No  . Sexual activity: Yes    Birth control/protection: Post-menopausal  Lifestyle  . Physical activity:    Days per week: Not on file    Minutes per session: Not on file  . Stress: Not on file    Relationships  . Social connections:    Talks on phone: Not on file    Gets together: Not on file    Attends religious service: Not on file    Active member of club or organization: Not on file    Attends meetings of clubs or organizations: Not on file    Relationship status: Not on file  . Intimate partner violence:    Fear of current or ex partner: Not on file    Emotionally abused: Not on file    Physically abused: Not on file    Forced sexual activity: Not on file  Other Topics Concern  . Not on file  Social History Narrative  . Not on file    Review of Systems: See HPI, otherwise negative ROS  Physical Exam: BP 118/75   Pulse 78   Temp (!) 97.2 F (36.2 C) (Oral)   Ht 5\' 5"  (1.651 m)   Wt 130 lb 6.4 oz (59.1 kg)   BMI 21.70 kg/m  General:   Alert,  Well-developed, well-nourished, pleasant and cooperative in NAD Mouth:  No deformity or lesions. Neck:  Supple; no masses or thyromegaly. No significant cervical adenopathy. Lungs:  Clear throughout to auscultation.   No wheezes, crackles, or rhonchi. No acute distress. Heart:  Regular rate and rhythm; no murmurs, clicks, rubs,  or gallops. Abdomen: Non-distended, normal bowel sounds.  Soft and nontender without appreciable mass or hepatosplenomegaly.  Pulses:  Normal pulses noted. Extremities:  Without clubbing or edema.  Impression/Plan: 77 year old lady with vague oropharyngeal dysphagia symptoms and hoarseness-nonspecific.  Sinus issues.  Typical GERD well-controlled on once daily PPI therapy.  Recommendations: Lets get a speech / swallowing evaluation to asesss oral phase swallowing function  Sinus issues may have more to do with hoarseness / swallowing difficulties than anything else  Continue Protonix 40 mg daily  Further recommendations to follow     Notice: This dictation was prepared with Dragon dictation along with smaller phrase technology. Any transcriptional errors that result from this process are  unintentional and may not be corrected upon review.

## 2017-12-19 ENCOUNTER — Other Ambulatory Visit (HOSPITAL_COMMUNITY): Payer: Self-pay | Admitting: Specialist

## 2017-12-19 DIAGNOSIS — R1313 Dysphagia, pharyngeal phase: Secondary | ICD-10-CM

## 2017-12-24 NOTE — Progress Notes (Signed)
Office Visit Note  Patient: Sheila Oliver             Date of Birth: [birth year]           MRN: [id number]             PCP: Monico Blitz, MD Referring: Monico Blitz, MD Visit Date: 01/07/2018 Occupation: @GUAROCC @  Subjective:  Pain in both hands   History of Present Illness: Sheila Oliver is a 77 y.o. female with history of osteoarthritis, DDD, and positive ANA.  She reports chronic pain in both hands. She reports joint swelling the right second finger. Her hand pain is exacerbated by peeling potatoes. She reports pain and occasional joint swelling of the right knee joint.  She uses voltaren gel topically which provides temporary pain relief. She continues to have severe lower back pain and right sided radiculopathy.  She had an epidural injection several weeks ago.  She continues to have numbness and pain that radiates down the left leg.   She denies any recent rashes or sun sensitivity.  She denies any symptoms of Raynaud's. She continues to have hair loss and wears a wig.  She denies any sores in her mouth or nose.  She reports mouth dryness but denies eye dryness.  She continues to take Aspirin 81 mg every other day.      Activities of Daily Living:  Patient reports morning stiffness for 0 none.   Patient Denies nocturnal pain.  Difficulty dressing/grooming: Denies Difficulty climbing stairs: Denies Difficulty getting out of chair: Denies Difficulty using hands for taps, buttons, cutlery, and/or writing: Reports  Review of Systems  Constitutional: Negative for fatigue.  HENT: Positive for mouth dryness. Negative for mouth sores and nose dryness.   Eyes: Negative for pain, visual disturbance and dryness.  Respiratory: Negative for cough, hemoptysis, shortness of breath and difficulty breathing.   Cardiovascular: Negative for chest pain, palpitations, hypertension and swelling in legs/feet.  Gastrointestinal: Negative for blood in stool, constipation and diarrhea.  Endocrine:  Negative for increased urination.  Genitourinary: Negative for difficulty urinating and painful urination.  Musculoskeletal: Positive for arthralgias, joint pain and joint swelling. Negative for myalgias, muscle weakness, morning stiffness, muscle tenderness and myalgias.  Skin: Negative for color change, pallor, rash, hair loss, nodules/bumps, skin tightness, ulcers and sensitivity to sunlight.  Allergic/Immunologic: Negative for susceptible to infections.  Neurological: Positive for numbness (R lower extremity). Negative for dizziness, headaches and weakness.  Hematological: Negative for bruising/bleeding tendency and swollen glands.  Psychiatric/Behavioral: Positive for sleep disturbance. Negative for depressed mood. The patient is not nervous/anxious.     PMFS History:  Patient Active Problem List   Diagnosis Date Noted  . External hemorrhoid 09/04/2017  . Chronic diarrhea 06/20/2016  . Osteopenia of multiple sites 05/15/2016  . Chronic pain syndrome 05/15/2016  . Primary insomnia 05/15/2016  . History of gastroesophageal reflux (GERD) 05/15/2016  . ANA positive 05/07/2016  . DJD (degenerative joint disease), cervical 05/07/2016  . Spondylosis of lumbar region without myelopathy or radiculopathy 05/07/2016  . Primary osteoarthritis of both hands 05/07/2016    Past Medical History:  Diagnosis Date  . C. difficile diarrhea   . DDD (degenerative disc disease), lumbar   . GERD (gastroesophageal reflux disease)   . Osteoporosis   . Scoliosis     History reviewed. No pertinent family history. Past Surgical History:  Procedure Laterality Date  . CATARACT EXTRACTION Left   . CERVICAL FUSION    . COLONOSCOPY WITH PROPOFOL N/A  05/31/2016   Dr. Gala Romney: sigmoid and descending colon diverticulosis, normal colonic biopsies  . ESOPHAGOGASTRODUODENOSCOPY (EGD) WITH PROPOFOL N/A 05/31/2016   Dr. Gala Romney: LA Grade A esophagitis, s/p dilation, erosive gastropathy, multiple benign gastric polyps,  normal duodenum  . repair of rectal fissure    . SUPERFICIAL KERATECTOMY Bilateral    Social History   Social History Narrative  . Not on file    Objective: Vital Signs: BP 133/81 (BP Location: Left Arm, Patient Position: Sitting, Cuff Size: Normal)   Pulse 72   Resp 18   Ht 5\' 5"  (1.651 m)   Wt 128 lb 3.2 oz (58.2 kg)   BMI 21.33 kg/m    Physical Exam  Constitutional: She is oriented to person, place, and time. She appears well-developed and well-nourished.  HENT:  Head: Normocephalic and atraumatic.  No oral or nasal ulcerations. No parotid swelling.   Eyes: Conjunctivae and EOM are normal.  Neck: Normal range of motion.  Cardiovascular: Normal rate, regular rhythm, normal heart sounds and intact distal pulses.  Pulmonary/Chest: Effort normal and breath sounds normal.  Abdominal: Soft. Bowel sounds are normal.  Lymphadenopathy:    She has no cervical adenopathy.  Neurological: She is alert and oriented to person, place, and time.  Skin: Skin is warm and dry. Capillary refill takes less than 2 seconds.  No malar rash noted. No telangectasias. No digital ulcerations or signs of gangrene.   Psychiatric: She has a normal mood and affect. Her behavior is normal.  Nursing note and vitals reviewed.    Musculoskeletal Exam: C-spine limited lateral rotation.  Thoracic and lumbar spine good ROM.  No midline spinal tenderness.  No SI joint tenderness. Shoulder joints, elbow joints, wrist joints, MCPs, PIPs, and DIPs good ROM with no synovitis. 2nd PIP inflammatory changes.  PIP and DIP synovial thickening.  Bilateral CMC joint synovial thickening.  Hip joints, knee joints, ankle joints, MTPs, PIPs, and DIPs good ROM with no synovitis.  No warmth or effusion of knee joints.  No ankle joint tenderness or swelling.   CDAI Exam: CDAI Score: Not documented Patient Global Assessment: Not documented; Provider Global Assessment: Not documented Swollen: 2 ; Tender: 2  Joint Exam       Right  Left  PIP 2  Swollen Tender  Swollen Tender     Investigation: No additional findings.  Imaging: Dg Op Swallowing Func-medicare/speech Path  Result Date: 12/31/2017 [facility] [address] [place], [place], [address] Phone: [phone]   Fax:  [phone] Modified Barium Swallow Patient Details Name: KHAMORA KARAN MRN: [id number] Date of Birth: [birth year] No data recorded Encounter Date: 12/30/2017 End of Session - 12/30/17 1759   Visit Number  1   Number of Visits  1   Authorization Type  Medicare   SLP Start Time  8101   SLP Stop Time   1410   SLP Time Calculation (min)  30 min   Activity Tolerance  Patient tolerated treatment well   Past Medical History: Diagnosis Date . DDD (degenerative disc disease), lumbar  . GERD (gastroesophageal reflux disease)  . Osteoporosis  Past Surgical History: Procedure Laterality Date . CATARACT EXTRACTION Left  . CERVICAL FUSION   . COLONOSCOPY WITH PROPOFOL N/A 05/31/2016  Dr. Gala Romney: sigmoid and descending colon diverticulosis, normal colonic biopsies . ESOPHAGOGASTRODUODENOSCOPY (EGD) WITH PROPOFOL N/A 05/31/2016  Dr. Gala Romney: LA Grade A esophagitis, s/p dilation, erosive gastropathy, multiple benign gastric polyps, normal duodenum . repair of rectal fissure   .  SUPERFICIAL KERATECTOMY Bilateral  There were no vitals filed for this visit. Subjective Assessment - 12/30/17 1755   Subjective  "Sometimes I get choked and I am not evening eating or drinking anything."   Patient is accompained by:  Family member   Special Tests  MBSS   Currently in Pain?  No/denies   General - 12/30/17 1756    General Information  Date of Onset  11/30/17   HPI   Sheila Oliver is a 77 y.o. female here for follow-up of diarrhea/hoarseness.  Diarrhea has resolved.  History of C. difficile in the past.  Hoarseness and difficulty initiating swallowing these days.  History of reflux esophagitis seen on 2018 EGD.  BPE earlier this year demonstrated  dysmotility prominent cricopharyngeus muscle.  No Zenker's diverticulum, no aspiration, no obstruction.  Patient now complains of difficulty initiating a swallow.  Chews her food well feels that she has to press on her chin to help with food "go down". Pt referred by Dr. Gala Romney for modified barium swallow study due to Pt with oomplaints of coughing and choking.   Type of Study  MBS-Modified Barium Swallow Study   Previous Swallow Assessment  None on record   Diet Prior to this Study  Regular;Thin liquids   Temperature Spikes Noted  No   Respiratory Status  Room air   History of Recent Intubation  No   Behavior/Cognition  Alert;Cooperative;Pleasant mood   Oral Cavity Assessment  Within Functional Limits   Oral Care Completed by SLP  No   Oral Cavity - Dentition  Adequate natural dentition   Vision  Functional for self feeding   Self-Feeding Abilities  Able to feed self   Patient Positioning  Upright in chair   Baseline Vocal Quality  Normal;Hoarse   Volitional Cough  Strong   Volitional Swallow  Able to elicit   Anatomy  Within functional limits   Pharyngeal Secretions  Not observed secondary MBS   Oral Preparation/Oral Phase - 12/30/17 1758    Oral Preparation/Oral Phase  Oral Phase  Within functional limits    Electrical stimulation - Oral Phase  Was Electrical Stimulation Used  No   Pharyngeal Phase - 12/30/17 1758    Pharyngeal Phase  Pharyngeal Phase  Within functional limits    Electrical Stimulation - Pharyngeal Phase  Was Electrical Stimulation Used  No   Cricopharyngeal Phase - 12/30/17 1758    Cervical Esophageal Phase  Cervical Esophageal Phase  Within functional limits  brief stasis of barium tablet in esophagus, delayed emptying of solids  Plan - 12/30/17 1800   Clinical Impression Statement  Oropharyngeal swallow is WNL. Pt with prompt swallow trigger, no penetration/aspiration, or significant residuals post swallow. Esophageal sweep revealed brief stasis of barium tablet near transaortic arch before  passing to stomach. Sweep also identified delayed emptying of solids. Pt's c/o may be attributed to esophageal reflux (choking episodes occurring without po intake, night waking due to excessive coughing, and vocal hoarseness) and would consider a referral to ENT to evaluate vocal cords and make further recommendations. Pt acknowledges recommendations and all questions answered. No further SLP f/u indicated at this time.   Patient will benefit from skilled therapeutic intervention in order to improve the following deficits and impairments:  Dysphagia, oropharyngeal phase Recommendations/Treatment - 12/30/17 1759    Swallow Evaluation Recommendations  Recommended Consults  Consider ENT evaluation   SLP Diet Recommendations  Thin;Age appropriate regular   Liquid Administration via  Cup;Straw   Medication Administration  Whole meds with liquid   Supervision  Patient able to self feed   Postural Changes  Seated upright at 90 degrees;Remain upright for at least 30 minutes after feeds/meals   Prognosis - 12/30/17 1759    Prognosis  Prognosis for Safe Diet Advancement  Good    Individuals Consulted  Consulted and Agree with Results and Recommendations  Patient   Problem List Patient Active Problem List  Diagnosis Date Noted . External hemorrhoid 09/04/2017 . Chronic diarrhea 06/20/2016 . Osteopenia of multiple sites 05/15/2016 . Chronic pain syndrome 05/15/2016 . Primary insomnia 05/15/2016 . History of gastroesophageal reflux (GERD) 05/15/2016 . ANA positive 05/07/2016 . DJD (degenerative joint disease), cervical 05/07/2016 . Spondylosis of lumbar region without myelopathy or radiculopathy 05/07/2016 . Primary osteoarthritis of both hands 05/07/2016 Thank you, Genene Churn, [facility] 12/30/2017, 6:01 PM [facility] [address] [place], [place], [address] Phone: [phone]   Fax:  [phone] Name: AANVI VOYLES MRN: [id number] Date of Birth: 11-01-1940  CLINICAL DATA:  Dysphagia EXAM: MODIFIED BARIUM SWALLOW TECHNIQUE: Different consistencies of barium were administered orally to the patient by the Speech Pathologist. Imaging of the pharynx was performed in the lateral projection. FLUOROSCOPY TIME:  Fluoroscopy Time:  2 minutes 0 seconds Radiation Exposure Index (if provided by the fluoroscopic device): 26.6 mGy Number of Acquired Spot Images: multiple fluoroscopic screen captures COMPARISON:  None FINDINGS: Thin liquid- within normal limits. No laryngeal penetration, aspiration or residuals Nectar thick liquid-not evaluated Honey-not evaluated Pure- within normal limits. No laryngeal penetration or aspiration. No residuals. Cracker-within normal limits. No laryngeal penetration, aspiration or residuals. Pure with cracker-not evaluated Barium tablet - within normal limits, swallowed within barium without difficulty. IMPRESSION: Normal exam. Please refer to the Speech Pathologists report for complete details and recommendations. Electronically Signed   By: Lavonia Dana M.D.   On: 12/30/2017 14:28    Recent Labs: Lab Results  Component Value Date   WBC 6.6 06/25/2016   HGB 14.0 06/25/2016   PLT 219 06/25/2016   NA 141 06/25/2016   K 4.2 06/25/2016   CL 99 06/25/2016   CO2 26 06/25/2016   GLUCOSE 96 06/25/2016   BUN 18 06/25/2016   CREATININE 1.00 06/25/2016   BILITOT 1.4 (H) 06/25/2016   ALKPHOS 66 06/25/2016   AST 11 06/25/2016   ALT 15 06/25/2016   PROT 6.6 06/25/2016   ALBUMIN 4.1 06/25/2016   CALCIUM 9.3 06/25/2016   GFRAA 65 06/25/2016    Speciality Comments: No specialty comments available.  Procedures:  No procedures performed Allergies: Cortisone   Assessment / Plan:     Visit Diagnoses: ANA positive - Positive ANA, positive double-stranded DNA, positive beta-2: She has no clinical features of autoimmune disease at this time.  We will check autoimmune labs today in the office.  She previously was positive beta-2 and continues  to take aspirin 81 mg every other day.  She has inflammation of bilateral second PIP joints on exam today.  She is encouraged to continue to use Voltaren gel topically.  She declined cortisone injections but today.  She has not had any recent rashes or sun sensitivity.  No malar rash was noted.  No cervical lymphadenopathy was palpated.  She continues to have mouth dryness but no parotid swelling was noted.  She has chronic hair loss and wears a wig.  She has not had any recent symptoms of Raynaud's and no digital ulcerations were noted.  She has not had any  shortness of breath or palpitations recently.  The following labs will be obtained today.- Plan: Beta-2 glycoprotein antibodies, Anti-DNA antibody, double-stranded, C3 and C4, Sedimentation rate, Urinalysis, Routine w reflex microscopic, ANA, AST, ALT  Primary osteoarthritis of both hands: She has PIP and DIP synovial thickening consistent with osteoarthritis.  She has no CMC joint synovial thickening.  She has inflammation of bilateral 2nd PIP joints.  She has been having increased difficulty opening jars and peeling potatoes.  She declined a PIP cortisone injection today.  She was encouraged to continue to use voltaren gel topically.  She was also given a handout of hand exercises that she can perform.    Trochanteric bursitis of left hip: She has no tenderness on exam.   DDD (degenerative disc disease), cervical - Status post discectomy: She has limited ROM with lateral rotation.  She has no symptoms of radiculopathy at this time.   DDD (degenerative disc disease), lumbar - With the scoliosis: Chronic pain.  She has right sided radiculopathy.  She had an epidural injection several weeks ago.   Hair loss: She continues to have chronic hair loss.  She currently is wearing a wig.   Osteopenia of multiple sites: She is taking a calcium and vitamin D supplement daily.   Chronic pain syndrome: She is on Tramadol 50 mg every 6 hours as needed for pain  relief.   Primary insomnia: She is on Trazodone 50 mg by mouth at bedtime.   Other medical conditions are listed as follows:   History of diarrhea  History of gastroesophageal reflux (GERD)   Orders: Orders Placed This Encounter  Procedures  . Beta-2 glycoprotein antibodies  . Anti-DNA antibody, double-stranded  . C3 and C4  . Sedimentation rate  . Urinalysis, Routine w reflex microscopic  . ANA  . AST  . ALT   No orders of the defined types were placed in this encounter.  Face to face time spent with patient was 30 minutes. Greater than 50% time was spent in counseling and co-ordination of care.   Follow-Up Instructions: Return in about 1 year (around 01/08/2019) for Osteoarthritis, DDD.   Ofilia Neas, PA-C   I examined and evaluated the patient with Hazel Sams PA.  Patient continues to have some hair loss and some discomfort from underlying osteoarthritis.  She has inflammatory osteoarthritis and had right second PIP swelling on my examination today.  She has no clinical features of autoimmune disease.  We will repeat her labs today.  I will contact her after the lab results are available.  I offered cortisone injection for her right second PIP joint of her right hand but she declined.  The plan of care was discussed as noted above.  Bo Merino, MD  Note - This record has been created using Editor, commissioning.  Chart creation errors have been sought, but may not always  have been located. Such creation errors do not reflect on  the standard of medical care.

## 2017-12-25 DIAGNOSIS — M5136 Other intervertebral disc degeneration, lumbar region: Secondary | ICD-10-CM | POA: Diagnosis not present

## 2017-12-25 DIAGNOSIS — M47816 Spondylosis without myelopathy or radiculopathy, lumbar region: Secondary | ICD-10-CM | POA: Diagnosis not present

## 2017-12-30 ENCOUNTER — Ambulatory Visit (HOSPITAL_COMMUNITY)
Admission: RE | Admit: 2017-12-30 | Discharge: 2017-12-30 | Disposition: A | Discharge status: Home / Self Care | Payer: Medicare Other | Source: Ambulatory Visit | Attending: Internal Medicine | Admitting: Internal Medicine

## 2017-12-30 ENCOUNTER — Other Ambulatory Visit: Payer: Self-pay

## 2017-12-30 ENCOUNTER — Ambulatory Visit (HOSPITAL_COMMUNITY): Payer: Medicare Other | Attending: Internal Medicine | Admitting: Speech Pathology

## 2017-12-30 ENCOUNTER — Encounter (HOSPITAL_COMMUNITY): Payer: Self-pay | Admitting: Speech Pathology

## 2017-12-30 DIAGNOSIS — R1313 Dysphagia, pharyngeal phase: Secondary | ICD-10-CM | POA: Insufficient documentation

## 2017-12-30 DIAGNOSIS — R1312 Dysphagia, oropharyngeal phase: Secondary | ICD-10-CM | POA: Diagnosis not present

## 2017-12-30 DIAGNOSIS — R1310 Dysphagia, unspecified: Secondary | ICD-10-CM | POA: Diagnosis not present

## 2017-12-30 DIAGNOSIS — R05 Cough: Secondary | ICD-10-CM | POA: Diagnosis not present

## 2017-12-30 NOTE — Therapy (Signed)
[place], [place], [address] Phone: [phone]   Fax:  [phone]  Modified Barium Swallow  Patient Details  Name: Sheila Oliver MRN: [id number] Date of Birth: 09-Sep-1940 No data recorded  Encounter Date: 12/30/2017  End of Session - 12/30/17 1759    Visit Number  1    Number of Visits  1    Authorization Type  Medicare    SLP Start Time  5284    SLP Stop Time   1410    SLP Time Calculation (min)  30 min    Activity Tolerance  Patient tolerated treatment well       Past Medical History:  Diagnosis Date  . DDD (degenerative disc disease), lumbar   . GERD (gastroesophageal reflux disease)   . Osteoporosis     Past Surgical History:  Procedure Laterality Date  . CATARACT EXTRACTION Left   . CERVICAL FUSION    . COLONOSCOPY WITH PROPOFOL N/A 05/31/2016   Dr. Gala Romney: sigmoid and descending colon diverticulosis, normal colonic biopsies  . ESOPHAGOGASTRODUODENOSCOPY (EGD) WITH PROPOFOL N/A 05/31/2016   Dr. Gala Romney: LA Grade A esophagitis, s/p dilation, erosive gastropathy, multiple benign gastric polyps, normal duodenum  . repair of rectal fissure    . SUPERFICIAL KERATECTOMY Bilateral     There were no vitals filed for this visit.  Subjective Assessment - 12/30/17 1755    Subjective  "Sometimes I get choked and I am not evening eating or drinking anything."    Patient is accompained by:  Family member    Special Tests  MBSS    Currently in Pain?  No/denies          General - 12/30/17 1756      General Information   Date of Onset  11/30/17    HPI   Sheila Oliver is a 77 y.o. female here for follow-up of diarrhea/hoarseness.  Diarrhea has resolved.  History of C. difficile in the past.  Hoarseness and difficulty initiating swallowing these days.  History of reflux esophagitis seen on 2018 EGD.  BPE earlier this year demonstrated dysmotility prominent cricopharyngeus muscle.  No Zenker's  diverticulum, no aspiration, no obstruction.  Patient now complains of difficulty initiating a swallow.  Chews her food well feels that she has to press on her chin to help with food "go down". Pt referred by Dr. Gala Romney for modified barium swallow study due to Pt with oomplaints of coughing and choking.    Type of Study  MBS-Modified Barium Swallow Study    Previous Swallow Assessment  None on record    Diet Prior to this Study  Regular;Thin liquids    Temperature Spikes Noted  No    Respiratory Status  Room air    History of Recent Intubation  No    Behavior/Cognition  Alert;Cooperative;Pleasant mood    Oral Cavity Assessment  Within Functional Limits    Oral Care Completed by SLP  No    Oral Cavity - Dentition  Adequate natural dentition    Vision  Functional for self feeding    Self-Feeding Abilities  Able to feed self    Patient Positioning  Upright in chair    Baseline Vocal Quality  Normal;Hoarse    Volitional Cough  Strong    Volitional Swallow  Able to elicit    Anatomy  Within functional limits    Pharyngeal Secretions  Not observed secondary MBS         Oral  Preparation/Oral Phase - 12/30/17 1758      Oral Preparation/Oral Phase   Oral Phase  Within functional limits      Electrical stimulation - Oral Phase   Was Electrical Stimulation Used  No       Pharyngeal Phase - 12/30/17 1758      Pharyngeal Phase   Pharyngeal Phase  Within functional limits      Electrical Stimulation - Pharyngeal Phase   Was Electrical Stimulation Used  No       Cricopharyngeal Phase - 12/30/17 1758      Cervical Esophageal Phase   Cervical Esophageal Phase  Within functional limits   brief stasis of barium tablet in esophagus, delayed emptying of solids       Plan - 12/30/17 1800    Clinical Impression Statement  Oropharyngeal swallow is WNL. Pt with prompt swallow trigger, no penetration/aspiration, or significant residuals post swallow. Esophageal sweep revealed brief stasis  of barium tablet near transaortic arch before passing to stomach. Sweep also identified delayed emptying of solids. Pt's c/o may be attributed to esophageal reflux (choking episodes occurring without po intake, night waking due to excessive coughing, and vocal hoarseness) and would consider a referral to ENT to evaluate vocal cords and make further recommendations. Pt acknowledges recommendations and all questions answered. No further SLP f/u indicated at this time.       Patient will benefit from skilled therapeutic intervention in order to improve the following deficits and impairments:   Dysphagia, oropharyngeal phase    Recommendations/Treatment - 12/30/17 1759      Swallow Evaluation Recommendations   Recommended Consults  Consider ENT evaluation    SLP Diet Recommendations  Thin;Age appropriate regular    Liquid Administration via  Cup;Straw    Medication Administration  Whole meds with liquid    Supervision  Patient able to self feed    Postural Changes  Seated upright at 90 degrees;Remain upright for at least 30 minutes after feeds/meals       Prognosis - 12/30/17 1759      Prognosis   Prognosis for Safe Diet Advancement  Good      Individuals Consulted   Consulted and Agree with Results and Recommendations  Patient       Problem List Patient Active Problem List   Diagnosis Date Noted  . External hemorrhoid 09/04/2017  . Chronic diarrhea 06/20/2016  . Osteopenia of multiple sites 05/15/2016  . Chronic pain syndrome 05/15/2016  . Primary insomnia 05/15/2016  . History of gastroesophageal reflux (GERD) 05/15/2016  . ANA positive 05/07/2016  . DJD (degenerative joint disease), cervical 05/07/2016  . Spondylosis of lumbar region without myelopathy or radiculopathy 05/07/2016  . Primary osteoarthritis of both hands 05/07/2016   Thank you,  Genene Churn, [place]  [facility] 12/30/2017, 6:01 PM  [place] [address] [place], [place], [address] Phone: [phone]   Fax:  [phone]  Name: Sheila Oliver MRN: [id number] Date of Birth: 05/20/1940

## 2018-01-06 ENCOUNTER — Other Ambulatory Visit: Payer: Self-pay | Admitting: *Deleted

## 2018-01-06 DIAGNOSIS — R490 Dysphonia: Secondary | ICD-10-CM

## 2018-01-06 DIAGNOSIS — R1310 Dysphagia, unspecified: Secondary | ICD-10-CM

## 2018-01-07 ENCOUNTER — Ambulatory Visit (INDEPENDENT_AMBULATORY_CARE_PROVIDER_SITE_OTHER): Payer: Medicare Other | Admitting: Rheumatology

## 2018-01-07 ENCOUNTER — Encounter: Payer: Self-pay | Admitting: Rheumatology

## 2018-01-07 VITALS — BP 133/81 | HR 72 | Resp 18 | Ht 65.0 in | Wt 128.2 lb

## 2018-01-07 DIAGNOSIS — G894 Chronic pain syndrome: Secondary | ICD-10-CM

## 2018-01-07 DIAGNOSIS — R768 Other specified abnormal immunological findings in serum: Secondary | ICD-10-CM

## 2018-01-07 DIAGNOSIS — M5030 Other cervical disc degeneration, unspecified cervical region: Secondary | ICD-10-CM | POA: Diagnosis not present

## 2018-01-07 DIAGNOSIS — M7062 Trochanteric bursitis, left hip: Secondary | ICD-10-CM | POA: Diagnosis not present

## 2018-01-07 DIAGNOSIS — M19042 Primary osteoarthritis, left hand: Secondary | ICD-10-CM | POA: Diagnosis not present

## 2018-01-07 DIAGNOSIS — M5136 Other intervertebral disc degeneration, lumbar region: Secondary | ICD-10-CM | POA: Diagnosis not present

## 2018-01-07 DIAGNOSIS — L659 Nonscarring hair loss, unspecified: Secondary | ICD-10-CM

## 2018-01-07 DIAGNOSIS — M19041 Primary osteoarthritis, right hand: Secondary | ICD-10-CM

## 2018-01-07 DIAGNOSIS — Z8719 Personal history of other diseases of the digestive system: Secondary | ICD-10-CM | POA: Diagnosis not present

## 2018-01-07 DIAGNOSIS — Z87898 Personal history of other specified conditions: Secondary | ICD-10-CM | POA: Diagnosis not present

## 2018-01-07 DIAGNOSIS — M51369 Other intervertebral disc degeneration, lumbar region without mention of lumbar back pain or lower extremity pain: Secondary | ICD-10-CM

## 2018-01-07 DIAGNOSIS — M8589 Other specified disorders of bone density and structure, multiple sites: Secondary | ICD-10-CM | POA: Diagnosis not present

## 2018-01-07 DIAGNOSIS — F5101 Primary insomnia: Secondary | ICD-10-CM | POA: Diagnosis not present

## 2018-01-07 NOTE — Patient Instructions (Signed)

## 2018-01-08 LAB — URINALYSIS, ROUTINE W REFLEX MICROSCOPIC
Bilirubin Urine: NEGATIVE
Glucose, UA: NEGATIVE
HGB URINE DIPSTICK: NEGATIVE
KETONES UR: NEGATIVE
LEUKOCYTES UA: NEGATIVE
Nitrite: NEGATIVE
PH: 5.5 (ref 5.0–8.0)
Protein, ur: NEGATIVE
Specific Gravity, Urine: 1.019 (ref 1.001–1.03)

## 2018-01-08 LAB — BETA-2 GLYCOPROTEIN ANTIBODIES: Beta-2 Glyco 1 IgA: <9 SAU (ref <=20)

## 2018-01-08 LAB — AST: AST: 8 U/L — ABNORMAL LOW (ref 10–35)

## 2018-01-08 LAB — ANA: ANA: NEGATIVE

## 2018-01-08 LAB — SEDIMENTATION RATE: Sed Rate: 2 mm/h (ref 0–30)

## 2018-01-08 LAB — C3 AND C4
C3 Complement: 121 mg/dL (ref 83–193)
C4 COMPLEMENT: 23 mg/dL (ref 15–57)

## 2018-01-08 LAB — ALT: ALT: 13 U/L (ref 6–29)

## 2018-01-08 LAB — ANTI-DNA ANTIBODY, DOUBLE-STRANDED: ds DNA Ab: 12 IU/mL — ABNORMAL HIGH

## 2018-01-09 NOTE — Progress Notes (Signed)
Beta 2 negative. Sed rate WNL. UA normal. ALT WNL. AST chronically low. Complements WNL. ANA negative. DsDNA elevated but trending down from previously labs.  Labs do not reveal a sign of a flare at this time.

## 2018-01-20 DIAGNOSIS — R232 Flushing: Secondary | ICD-10-CM | POA: Diagnosis not present

## 2018-01-20 DIAGNOSIS — Z299 Encounter for prophylactic measures, unspecified: Secondary | ICD-10-CM | POA: Diagnosis not present

## 2018-01-20 DIAGNOSIS — Z2821 Immunization not carried out because of patient refusal: Secondary | ICD-10-CM | POA: Diagnosis not present

## 2018-01-20 DIAGNOSIS — Z6821 Body mass index (BMI) 21.0-21.9, adult: Secondary | ICD-10-CM | POA: Diagnosis not present

## 2018-01-20 DIAGNOSIS — E7800 Pure hypercholesterolemia, unspecified: Secondary | ICD-10-CM | POA: Diagnosis not present

## 2018-01-20 DIAGNOSIS — R1310 Dysphagia, unspecified: Secondary | ICD-10-CM | POA: Diagnosis not present

## 2018-01-20 DIAGNOSIS — I10 Essential (primary) hypertension: Secondary | ICD-10-CM | POA: Diagnosis not present

## 2018-01-21 DIAGNOSIS — G894 Chronic pain syndrome: Secondary | ICD-10-CM | POA: Diagnosis not present

## 2018-01-21 DIAGNOSIS — M47816 Spondylosis without myelopathy or radiculopathy, lumbar region: Secondary | ICD-10-CM | POA: Diagnosis not present

## 2018-01-21 DIAGNOSIS — M5136 Other intervertebral disc degeneration, lumbar region: Secondary | ICD-10-CM | POA: Diagnosis not present

## 2018-02-20 ENCOUNTER — Encounter: Payer: Self-pay | Admitting: Rheumatology

## 2018-02-20 DIAGNOSIS — I10 Essential (primary) hypertension: Secondary | ICD-10-CM | POA: Diagnosis not present

## 2018-03-03 DIAGNOSIS — Z6821 Body mass index (BMI) 21.0-21.9, adult: Secondary | ICD-10-CM | POA: Diagnosis not present

## 2018-03-03 DIAGNOSIS — Z299 Encounter for prophylactic measures, unspecified: Secondary | ICD-10-CM | POA: Diagnosis not present

## 2018-03-03 DIAGNOSIS — R1310 Dysphagia, unspecified: Secondary | ICD-10-CM | POA: Diagnosis not present

## 2018-03-03 DIAGNOSIS — I10 Essential (primary) hypertension: Secondary | ICD-10-CM | POA: Diagnosis not present

## 2018-03-03 DIAGNOSIS — M329 Systemic lupus erythematosus, unspecified: Secondary | ICD-10-CM | POA: Diagnosis not present

## 2018-04-01 DIAGNOSIS — Z888 Allergy status to other drugs, medicaments and biological substances status: Secondary | ICD-10-CM | POA: Diagnosis not present

## 2018-04-01 DIAGNOSIS — R490 Dysphonia: Secondary | ICD-10-CM | POA: Diagnosis not present

## 2018-04-01 DIAGNOSIS — J384 Edema of larynx: Secondary | ICD-10-CM | POA: Diagnosis not present

## 2018-04-01 DIAGNOSIS — J383 Other diseases of vocal cords: Secondary | ICD-10-CM | POA: Diagnosis not present

## 2018-05-01 DIAGNOSIS — E2839 Other primary ovarian failure: Secondary | ICD-10-CM | POA: Diagnosis not present

## 2018-05-05 DIAGNOSIS — Z789 Other specified health status: Secondary | ICD-10-CM | POA: Diagnosis not present

## 2018-05-05 DIAGNOSIS — G4700 Insomnia, unspecified: Secondary | ICD-10-CM | POA: Diagnosis not present

## 2018-05-05 DIAGNOSIS — B001 Herpesviral vesicular dermatitis: Secondary | ICD-10-CM | POA: Diagnosis not present

## 2018-05-05 DIAGNOSIS — Z6821 Body mass index (BMI) 21.0-21.9, adult: Secondary | ICD-10-CM | POA: Diagnosis not present

## 2018-05-05 DIAGNOSIS — Z299 Encounter for prophylactic measures, unspecified: Secondary | ICD-10-CM | POA: Diagnosis not present

## 2018-05-05 DIAGNOSIS — I10 Essential (primary) hypertension: Secondary | ICD-10-CM | POA: Diagnosis not present

## 2018-05-05 DIAGNOSIS — M329 Systemic lupus erythematosus, unspecified: Secondary | ICD-10-CM | POA: Diagnosis not present

## 2018-05-06 DIAGNOSIS — H353131 Nonexudative age-related macular degeneration, bilateral, early dry stage: Secondary | ICD-10-CM | POA: Diagnosis not present

## 2018-05-23 DIAGNOSIS — G894 Chronic pain syndrome: Secondary | ICD-10-CM | POA: Diagnosis not present

## 2018-05-23 DIAGNOSIS — M4186 Other forms of scoliosis, lumbar region: Secondary | ICD-10-CM | POA: Diagnosis not present

## 2018-05-23 DIAGNOSIS — M47816 Spondylosis without myelopathy or radiculopathy, lumbar region: Secondary | ICD-10-CM | POA: Diagnosis not present

## 2018-05-23 DIAGNOSIS — M5136 Other intervertebral disc degeneration, lumbar region: Secondary | ICD-10-CM | POA: Diagnosis not present

## 2018-05-23 DIAGNOSIS — M47814 Spondylosis without myelopathy or radiculopathy, thoracic region: Secondary | ICD-10-CM | POA: Diagnosis not present

## 2018-05-23 DIAGNOSIS — M7918 Myalgia, other site: Secondary | ICD-10-CM | POA: Diagnosis not present

## 2018-09-03 DIAGNOSIS — Z299 Encounter for prophylactic measures, unspecified: Secondary | ICD-10-CM | POA: Diagnosis not present

## 2018-09-03 DIAGNOSIS — Z713 Dietary counseling and surveillance: Secondary | ICD-10-CM | POA: Diagnosis not present

## 2018-09-03 DIAGNOSIS — R030 Elevated blood-pressure reading, without diagnosis of hypertension: Secondary | ICD-10-CM | POA: Diagnosis not present

## 2018-09-03 DIAGNOSIS — Z6821 Body mass index (BMI) 21.0-21.9, adult: Secondary | ICD-10-CM | POA: Diagnosis not present

## 2018-09-03 DIAGNOSIS — M329 Systemic lupus erythematosus, unspecified: Secondary | ICD-10-CM | POA: Diagnosis not present

## 2018-09-29 ENCOUNTER — Other Ambulatory Visit (HOSPITAL_COMMUNITY): Payer: Self-pay | Admitting: Internal Medicine

## 2018-09-29 ENCOUNTER — Other Ambulatory Visit: Payer: Self-pay

## 2018-09-29 ENCOUNTER — Ambulatory Visit (HOSPITAL_COMMUNITY)
Admission: RE | Admit: 2018-09-29 | Discharge: 2018-09-29 | Disposition: A | Discharge status: Home / Self Care | Payer: Medicare Other | Source: Ambulatory Visit | Attending: Internal Medicine | Admitting: Internal Medicine

## 2018-09-29 DIAGNOSIS — Z1231 Encounter for screening mammogram for malignant neoplasm of breast: Secondary | ICD-10-CM

## 2018-10-29 DIAGNOSIS — M47816 Spondylosis without myelopathy or radiculopathy, lumbar region: Secondary | ICD-10-CM | POA: Diagnosis not present

## 2018-10-29 DIAGNOSIS — M5136 Other intervertebral disc degeneration, lumbar region: Secondary | ICD-10-CM | POA: Diagnosis not present

## 2018-10-29 DIAGNOSIS — G894 Chronic pain syndrome: Secondary | ICD-10-CM | POA: Diagnosis not present

## 2018-11-12 DIAGNOSIS — Z713 Dietary counseling and surveillance: Secondary | ICD-10-CM | POA: Diagnosis not present

## 2018-11-12 DIAGNOSIS — Z299 Encounter for prophylactic measures, unspecified: Secondary | ICD-10-CM | POA: Diagnosis not present

## 2018-11-12 DIAGNOSIS — M545 Low back pain: Secondary | ICD-10-CM | POA: Diagnosis not present

## 2018-11-12 DIAGNOSIS — Z6821 Body mass index (BMI) 21.0-21.9, adult: Secondary | ICD-10-CM | POA: Diagnosis not present

## 2018-11-12 DIAGNOSIS — M329 Systemic lupus erythematosus, unspecified: Secondary | ICD-10-CM | POA: Diagnosis not present

## 2018-11-19 DIAGNOSIS — M4726 Other spondylosis with radiculopathy, lumbar region: Secondary | ICD-10-CM | POA: Diagnosis not present

## 2018-11-19 DIAGNOSIS — M48062 Spinal stenosis, lumbar region with neurogenic claudication: Secondary | ICD-10-CM | POA: Diagnosis not present

## 2018-11-19 DIAGNOSIS — M5136 Other intervertebral disc degeneration, lumbar region: Secondary | ICD-10-CM | POA: Diagnosis not present

## 2018-12-02 DIAGNOSIS — Z713 Dietary counseling and surveillance: Secondary | ICD-10-CM | POA: Diagnosis not present

## 2018-12-02 DIAGNOSIS — M545 Low back pain: Secondary | ICD-10-CM | POA: Diagnosis not present

## 2018-12-02 DIAGNOSIS — Z9181 History of falling: Secondary | ICD-10-CM | POA: Diagnosis not present

## 2018-12-02 DIAGNOSIS — Z6821 Body mass index (BMI) 21.0-21.9, adult: Secondary | ICD-10-CM | POA: Diagnosis not present

## 2018-12-02 DIAGNOSIS — M47816 Spondylosis without myelopathy or radiculopathy, lumbar region: Secondary | ICD-10-CM | POA: Diagnosis not present

## 2018-12-02 DIAGNOSIS — K219 Gastro-esophageal reflux disease without esophagitis: Secondary | ICD-10-CM | POA: Diagnosis not present

## 2018-12-02 DIAGNOSIS — Z299 Encounter for prophylactic measures, unspecified: Secondary | ICD-10-CM | POA: Diagnosis not present

## 2018-12-02 DIAGNOSIS — M5136 Other intervertebral disc degeneration, lumbar region: Secondary | ICD-10-CM | POA: Diagnosis not present

## 2018-12-11 DIAGNOSIS — Z0000 Encounter for general adult medical examination without abnormal findings: Secondary | ICD-10-CM | POA: Diagnosis not present

## 2018-12-11 DIAGNOSIS — E7800 Pure hypercholesterolemia, unspecified: Secondary | ICD-10-CM | POA: Diagnosis not present

## 2018-12-11 DIAGNOSIS — Z1339 Encounter for screening examination for other mental health and behavioral disorders: Secondary | ICD-10-CM | POA: Diagnosis not present

## 2018-12-11 DIAGNOSIS — Z1211 Encounter for screening for malignant neoplasm of colon: Secondary | ICD-10-CM | POA: Diagnosis not present

## 2018-12-11 DIAGNOSIS — E559 Vitamin D deficiency, unspecified: Secondary | ICD-10-CM | POA: Diagnosis not present

## 2018-12-11 DIAGNOSIS — R5383 Other fatigue: Secondary | ICD-10-CM | POA: Diagnosis not present

## 2018-12-11 DIAGNOSIS — Z7189 Other specified counseling: Secondary | ICD-10-CM | POA: Diagnosis not present

## 2018-12-11 DIAGNOSIS — Z6821 Body mass index (BMI) 21.0-21.9, adult: Secondary | ICD-10-CM | POA: Diagnosis not present

## 2018-12-11 DIAGNOSIS — Z1331 Encounter for screening for depression: Secondary | ICD-10-CM | POA: Diagnosis not present

## 2018-12-11 DIAGNOSIS — Z299 Encounter for prophylactic measures, unspecified: Secondary | ICD-10-CM | POA: Diagnosis not present

## 2018-12-12 DIAGNOSIS — E7800 Pure hypercholesterolemia, unspecified: Secondary | ICD-10-CM | POA: Diagnosis not present

## 2018-12-12 DIAGNOSIS — R5383 Other fatigue: Secondary | ICD-10-CM | POA: Diagnosis not present

## 2018-12-12 DIAGNOSIS — M329 Systemic lupus erythematosus, unspecified: Secondary | ICD-10-CM | POA: Diagnosis not present

## 2018-12-12 DIAGNOSIS — E559 Vitamin D deficiency, unspecified: Secondary | ICD-10-CM | POA: Diagnosis not present

## 2018-12-12 DIAGNOSIS — Z79899 Other long term (current) drug therapy: Secondary | ICD-10-CM | POA: Diagnosis not present

## 2018-12-23 DIAGNOSIS — M47816 Spondylosis without myelopathy or radiculopathy, lumbar region: Secondary | ICD-10-CM | POA: Diagnosis not present

## 2018-12-23 DIAGNOSIS — M5136 Other intervertebral disc degeneration, lumbar region: Secondary | ICD-10-CM | POA: Diagnosis not present

## 2018-12-23 DIAGNOSIS — M4807 Spinal stenosis, lumbosacral region: Secondary | ICD-10-CM | POA: Diagnosis not present

## 2018-12-23 DIAGNOSIS — Z79891 Long term (current) use of opiate analgesic: Secondary | ICD-10-CM | POA: Diagnosis not present

## 2018-12-23 DIAGNOSIS — G8929 Other chronic pain: Secondary | ICD-10-CM | POA: Diagnosis not present

## 2018-12-23 DIAGNOSIS — W010XXA Fall on same level from slipping, tripping and stumbling without subsequent striking against object, initial encounter: Secondary | ICD-10-CM | POA: Diagnosis not present

## 2018-12-23 DIAGNOSIS — M5416 Radiculopathy, lumbar region: Secondary | ICD-10-CM | POA: Diagnosis not present

## 2018-12-23 DIAGNOSIS — M25561 Pain in right knee: Secondary | ICD-10-CM | POA: Diagnosis not present

## 2018-12-23 NOTE — Progress Notes (Deleted)
Office Visit Note  Patient: Sheila Oliver             Date of Birth: 06-22-1940           MRN: [id number]             PCP: Monico Blitz, MD Referring: Monico Blitz, MD Visit Date: 01/06/2019 Occupation: @GUAROCC @  Subjective:  No chief complaint on file.   History of Present Illness: Sheila Oliver is a 78 y.o. female ***   Activities of Daily Living:  Patient reports morning stiffness for *** {minute/hour:19697}.   Patient {ACTIONS;DENIES/REPORTS:21021675::"Denies"} nocturnal pain.  Difficulty dressing/grooming: {ACTIONS;DENIES/REPORTS:21021675::"Denies"} Difficulty climbing stairs: {ACTIONS;DENIES/REPORTS:21021675::"Denies"} Difficulty getting out of chair: {ACTIONS;DENIES/REPORTS:21021675::"Denies"} Difficulty using hands for taps, buttons, cutlery, and/or writing: {ACTIONS;DENIES/REPORTS:21021675::"Denies"}  No Rheumatology ROS completed.   PMFS History:  Patient Active Problem List   Diagnosis Date Noted  . External hemorrhoid 09/04/2017  . Chronic diarrhea 06/20/2016  . Osteopenia of multiple sites 05/15/2016  . Chronic pain syndrome 05/15/2016  . Primary insomnia 05/15/2016  . History of gastroesophageal reflux (GERD) 05/15/2016  . ANA positive 05/07/2016  . DJD (degenerative joint disease), cervical 05/07/2016  . Spondylosis of lumbar region without myelopathy or radiculopathy 05/07/2016  . Primary osteoarthritis of both hands 05/07/2016    Past Medical History:  Diagnosis Date  . C. difficile diarrhea   . DDD (degenerative disc disease), lumbar   . GERD (gastroesophageal reflux disease)   . Osteoporosis   . Scoliosis     No family history on file. Past Surgical History:  Procedure Laterality Date  . CATARACT EXTRACTION Left   . CERVICAL FUSION    . COLONOSCOPY WITH PROPOFOL N/A 05/31/2016   Dr. Gala Romney: sigmoid and descending colon diverticulosis, normal colonic biopsies  . ESOPHAGOGASTRODUODENOSCOPY (EGD) WITH PROPOFOL N/A 05/31/2016   Dr. Gala Romney: LA  Grade A esophagitis, s/p dilation, erosive gastropathy, multiple benign gastric polyps, normal duodenum  . repair of rectal fissure    . SUPERFICIAL KERATECTOMY Bilateral    Social History   Social History Narrative  . Not on file    There is no immunization history on file for this patient.   Objective: Vital Signs: There were no vitals taken for this visit.   Physical Exam   Musculoskeletal Exam: ***  CDAI Exam: CDAI Score: - Patient Global: -; Provider Global: - Swollen: -; Tender: - Joint Exam   No joint exam has been documented for this visit   There is currently no information documented on the homunculus. Go to the Rheumatology activity and complete the homunculus joint exam.  Investigation: No additional findings.  Imaging: No results found.  Recent Labs: Lab Results  Component Value Date   WBC 6.6 06/25/2016   HGB 14.0 06/25/2016   PLT 219 06/25/2016   NA 141 06/25/2016   K 4.2 06/25/2016   CL 99 06/25/2016   CO2 26 06/25/2016   GLUCOSE 96 06/25/2016   BUN 18 06/25/2016   CREATININE 1.00 06/25/2016   BILITOT 1.4 (H) 06/25/2016   ALKPHOS 66 06/25/2016   AST 8 (L) 01/07/2018   ALT 13 01/07/2018   PROT 6.6 06/25/2016   ALBUMIN 4.1 06/25/2016   CALCIUM 9.3 06/25/2016   GFRAA 65 06/25/2016    Speciality Comments: No specialty comments available.  Procedures:  No procedures performed Allergies: Cortisone   Assessment / Plan:     Visit Diagnoses: No diagnosis found.  Orders: No orders of the defined types were placed in this encounter.  No orders of  the defined types were placed in this encounter.   Face-to-face time spent with patient was *** minutes. Greater than 50% of time was spent in counseling and coordination of care.  Follow-Up Instructions: No follow-ups on file.   Earnestine Mealing, CMA  Note - This record has been created using Editor, commissioning.  Chart creation errors have been sought, but may not always  have been located.  Such creation errors do not reflect on  the standard of medical care.

## 2019-01-05 NOTE — Progress Notes (Signed)
Office Visit Note  Patient: Sheila Oliver             Date of Birth: August 23, 1940           MRN: [id number]             PCP: Monico Blitz, MD Referring: Monico Blitz, MD Visit Date: 01/06/2019 Occupation: @GUAROCC @  Subjective:  Pain in both knee joints   History of Present Illness: Sheila Oliver is a 78 y.o. female with history of positive ANA, osteoarthritis, and DDD.  She presents today with right knee joint pain.  She states she had a cortisone injection 2 weeks ago by her pain management. She states the pain and swelling has only minimally improved.  She is having severe right knee joint pain and swelling currently.  She continues to see pain management for lower back pain.   She is scheduled for an epidural injection in 1 week.      Activities of Daily Living:  Patient reports morning stiffness for 0 minutes.   Patient Reports nocturnal pain.  Difficulty dressing/grooming: Denies Difficulty climbing stairs: Denies Difficulty getting out of chair: Denies Difficulty using hands for taps, buttons, cutlery, and/or writing: Reports  Review of Systems  Constitutional: Negative for fatigue.  HENT: Negative for mouth sores, mouth dryness and nose dryness.   Eyes: Negative for pain, itching, visual disturbance and dryness.  Respiratory: Negative for cough, hemoptysis, shortness of breath, wheezing and difficulty breathing.   Cardiovascular: Negative for chest pain, palpitations, hypertension and swelling in legs/feet.  Gastrointestinal: Negative for blood in stool, constipation and diarrhea.  Endocrine: Negative for increased urination.  Genitourinary: Negative for difficulty urinating and painful urination.  Musculoskeletal: Positive for arthralgias, joint pain and joint swelling. Negative for myalgias, muscle weakness, morning stiffness, muscle tenderness and myalgias.  Skin: Negative for color change, pallor, rash, hair loss, nodules/bumps, redness, skin tightness, ulcers and  sensitivity to sunlight.  Allergic/Immunologic: Negative for susceptible to infections.  Neurological: Negative for dizziness, light-headedness, numbness, headaches, memory loss and weakness.  Hematological: Negative for bruising/bleeding tendency and swollen glands.  Psychiatric/Behavioral: Negative for depressed mood, confusion and sleep disturbance. The patient is not nervous/anxious.     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 difficile diarrhea    DDD (degenerative disc disease), lumbar    GERD (gastroesophageal reflux disease)    Osteoporosis    Scoliosis     History reviewed. No pertinent family history. Past Surgical History:  Procedure Laterality Date   CATARACT EXTRACTION Left    CERVICAL FUSION     COLONOSCOPY WITH PROPOFOL N/A 05/31/2016   Dr. Gala Romney: sigmoid and descending colon diverticulosis, normal colonic biopsies   ESOPHAGOGASTRODUODENOSCOPY (EGD) WITH PROPOFOL N/A 05/31/2016   Dr. Gala Romney: LA Grade A esophagitis, s/p dilation, erosive gastropathy, multiple benign gastric polyps, normal duodenum   repair of rectal fissure     SUPERFICIAL KERATECTOMY Bilateral    Social History   Social History Narrative   Not on file  There is no immunization history on file for this patient.   Objective: Vital Signs: BP 127/83 (BP Location: Left Arm, Patient Position: Sitting, Cuff Size: Normal)    Pulse 87    Resp 13    Ht 5\' 5"  (1.651 m)    Wt 125  lb 6.4 oz (56.9 kg)    BMI 20.87 kg/m    Physical Exam Vitals signs and nursing note reviewed.  Constitutional:      Appearance: She is well-developed.  HENT:     Head: Normocephalic and atraumatic.  Eyes:     Conjunctiva/sclera: Conjunctivae normal.  Neck:     Musculoskeletal: Normal range of motion.  Cardiovascular:     Rate and Rhythm: Normal rate and regular rhythm.     Heart sounds: Normal heart sounds.  Pulmonary:     Effort: Pulmonary effort is normal.     Breath sounds: Normal breath sounds.  Abdominal:     General: Bowel sounds are normal.     Palpations: Abdomen is soft.  Lymphadenopathy:     Cervical: No cervical adenopathy.  Skin:    General: Skin is warm and dry.     Capillary Refill: Capillary refill takes less than 2 seconds.  Neurological:     Mental Status: She is alert and oriented to person, place, and time.  Psychiatric:        Behavior: Behavior normal.      Musculoskeletal Exam: C-spine good ROM.  Limited ROM of lumbar spine with discomfort. Shoulder joints, elbow joints, wrist joints, MCPs, PIPs, and DIPs good ROM with no synovitis.  PIP and DIP synovial thickening.  Subluxation of the first DIP joint.  CMC joint synovial thickening bilaterally.Complete fist formation bilaterally.  Hip joints, knee joints, ankle joints, MTPs, PIPs, and DIPs good ROM with no synovitis.  No warmth or effusion of knee joints.  No tenderness or swelling of ankle joints.   CDAI Exam: CDAI Score: -- Patient Global: --; Provider Global: -- Swollen: --; Tender: -- Joint Exam   No joint exam has been documented for this visit   There is currently no information documented on the homunculus. Go to the Rheumatology activity and complete the homunculus joint exam.  Investigation: No additional findings.  Imaging: Xr Knee 3 View Left  Result Date: 01/06/2019 Mild medial compartment narrowing was noted.  Mild patellofemoral narrowing was noted.  No chondrocalcinosis was  noted. Impression: These findings are consistent with mild osteoarthritis and mild chondromalacia patella.  Xr Knee 3 View Right  Result Date: 01/06/2019 No medial lateral compartment narrowing was noted.  Mild patellofemoral narrowing was noted.  No chondrocalcinosis was noted. 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   Recent Labs: Lab Results  Component Value Date   WBC 6.6 06/25/2016   HGB 14.0 06/25/2016   PLT 219 06/25/2016   NA 141 06/25/2016   K 4.2 06/25/2016   CL 99 06/25/2016   CO2 26 06/25/2016   GLUCOSE 96 06/25/2016   BUN 18 06/25/2016   CREATININE 1.00 06/25/2016   BILITOT 1.4 (H) 06/25/2016   ALKPHOS 66 06/25/2016   AST 8 (L) 01/07/2018   ALT 13 01/07/2018   PROT 6.6 06/25/2016   ALBUMIN 4.1 06/25/2016   CALCIUM 9.3 06/25/2016   GFRAA 65 06/25/2016    Speciality Comments: No specialty comments available.  Procedures:  No procedures performed Allergies: Cortisone   RF, anti-CCP, and dsDNA    Assessment / Plan:  Visit Diagnoses: ANA positive - Positive ANA, positive double-stranded DNA, positive beta-2: 12/12/18: Vitamin D 48.3, TSH 3.17, CBC WNL, LDL 107-rest of lipid panel WNL, creatinine 1.05 and GFR is 52, CRP<1, C3 108, C4 18-She has no clinical features of autoimmune disease at this time.  Lab work from 12/12/2018 was reviewed with the patient and all questions were addressed.  She has been experiencing pain in bilateral knee joints.  X-rays of both knees were obtained today which revealed mild osteoarthritis.  She had a right knee joint cortisone injection about 2 weeks ago which provided minimal relief.  No warmth or effusion of the knee joints were noted on exam today.  She does give a history of intermittent right knee joint swelling so we will obtain an RF, anti-CCP, and double-stranded DNA.  Orders for these labs were provided to the patient.  She was encouraged use Voltaren gel topically as needed  for pain relief.  She will follow-up in the office in 6 months.  Primary osteoarthritis of both hands: She has PIP and DIP synovial thickening consistent with osteoarthritis of both hands.  She has bilateral CMC joint synovial thickening.  She has subluxation of the right first DIP joint.  She experiences intermittent pain and stiffness in both hands.  Joint protection and muscle strengthening were discussed.  She can use Voltaren gel topically as needed for pain relief.  Trochanteric bursitis of left hip: She has tenderness over the left trochanteric bursa.  She was encouraged to perform stretching exercises on a regular basis.  Chronic pain of both knees - She presents today with pain in both knee joints.  She is having severe pain in the right knee joint.  According to the patient she has been having increased right knee joint pain as well as joint swelling.  She had a cortisone injection in the right knee joint about 2 weeks ago by her pain management specialist.  She has noticed minimal improvement since having a cortisone injection.  X-rays of both knee joints were obtained today, which revealed mild osteoarthritis of both knee joints.  She has good ROM of both knee joints.  No warmth or effusion noted on exam.  She was given a order for lab work to be drawn including RF, anti-CCP, double-stranded DNA she was encouraged to use voltaren gel topically as needed for pain relief.  A refill of voltaren gel was sent to the pharmacy. Plan: XR KNEE 3 VIEW RIGHT, XR KNEE 3 VIEW LEFT    Primary osteoarthritis of both knees: She presents today with pain in both knee joints.  She is been having severe pain intermittent joint swelling in the right knee.  She requires injection in the right knee about 2 weeks ago which provided minimal relief.  X-rays of both knees were obtained today which revealed mild osteoarthritis.  DDD (degenerative disc disease), cervical -  Status post discectomy  DDD (degenerative disc  disease), lumbar - W/ scoliosis and facet joint arthropathy  Osteopenia of multiple sites  Chronic pain syndrome  Primary insomnia  History of gastroesophageal reflux (GERD)    Orders: Orders Placed This Encounter  Procedures   XR KNEE 3 VIEW RIGHT   XR KNEE 3 VIEW LEFT   Meds ordered this encounter  Medications   diclofenac sodium (VOLTAREN) 1 % GEL    Sig: Apply 2-4 grams to affected joint 4 times daily as needed.    Dispense:  400 g    Refill:  2  Face-to-face time spent with patient was 30 minutes. Greater than 50% of time was spent in counseling and coordination of care.  Follow-Up Instructions: Return in about 6 months (around 07/07/2019) for Positive ANA, Osteoarthritis, DDD.   Ofilia Neas, PA-C  Note - This record has been created using Dragon software.  Chart creation errors have been sought, but may not always  have been located. Such creation errors do not reflect on  the standard of medical care.

## 2019-01-06 ENCOUNTER — Ambulatory Visit: Payer: Self-pay

## 2019-01-06 ENCOUNTER — Ambulatory Visit: Payer: Self-pay | Admitting: Rheumatology

## 2019-01-06 ENCOUNTER — Other Ambulatory Visit: Payer: Self-pay | Admitting: Pharmacist

## 2019-01-06 ENCOUNTER — Other Ambulatory Visit: Payer: Self-pay

## 2019-01-06 ENCOUNTER — Encounter: Payer: Self-pay | Admitting: Physician Assistant

## 2019-01-06 ENCOUNTER — Ambulatory Visit (INDEPENDENT_AMBULATORY_CARE_PROVIDER_SITE_OTHER): Payer: Medicare Other

## 2019-01-06 ENCOUNTER — Ambulatory Visit (INDEPENDENT_AMBULATORY_CARE_PROVIDER_SITE_OTHER): Payer: Medicare Other | Admitting: Physician Assistant

## 2019-01-06 VITALS — BP 127/83 | HR 87 | Resp 13 | Ht 65.0 in | Wt 125.4 lb

## 2019-01-06 DIAGNOSIS — M19041 Primary osteoarthritis, right hand: Secondary | ICD-10-CM

## 2019-01-06 DIAGNOSIS — R768 Other specified abnormal immunological findings in serum: Secondary | ICD-10-CM | POA: Diagnosis not present

## 2019-01-06 DIAGNOSIS — M25561 Pain in right knee: Secondary | ICD-10-CM

## 2019-01-06 DIAGNOSIS — F5101 Primary insomnia: Secondary | ICD-10-CM | POA: Diagnosis not present

## 2019-01-06 DIAGNOSIS — Z8719 Personal history of other diseases of the digestive system: Secondary | ICD-10-CM | POA: Diagnosis not present

## 2019-01-06 DIAGNOSIS — M170 Bilateral primary osteoarthritis of knee: Secondary | ICD-10-CM

## 2019-01-06 DIAGNOSIS — M19042 Primary osteoarthritis, left hand: Secondary | ICD-10-CM

## 2019-01-06 DIAGNOSIS — G8929 Other chronic pain: Secondary | ICD-10-CM

## 2019-01-06 DIAGNOSIS — M5136 Other intervertebral disc degeneration, lumbar region: Secondary | ICD-10-CM | POA: Diagnosis not present

## 2019-01-06 DIAGNOSIS — M8589 Other specified disorders of bone density and structure, multiple sites: Secondary | ICD-10-CM | POA: Diagnosis not present

## 2019-01-06 DIAGNOSIS — G894 Chronic pain syndrome: Secondary | ICD-10-CM | POA: Diagnosis not present

## 2019-01-06 DIAGNOSIS — M25562 Pain in left knee: Secondary | ICD-10-CM

## 2019-01-06 DIAGNOSIS — M7062 Trochanteric bursitis, left hip: Secondary | ICD-10-CM | POA: Diagnosis not present

## 2019-01-06 DIAGNOSIS — M5030 Other cervical disc degeneration, unspecified cervical region: Secondary | ICD-10-CM | POA: Diagnosis not present

## 2019-01-06 MED ORDER — DICLOFENAC SODIUM 1 % TD GEL: TRANSDERMAL | 2 refills | Status: AC

## 2019-01-06 NOTE — Progress Notes (Signed)
Patient at [facility] in [place] for labs. Spoke with Vaughan Basta. Patient was given a prescription for labs which was missing diagnosis code. Diagnosis code given. Unable to determine account number.  Labs ordered in order to be drawn at [facility] : [facility].   Instructed Vaughan Basta to call back if she continues to have trouble.     Mariella Saa, PharmD, [place], [place] Clinical Specialty Pharmacist [phone]  01/06/2019 4:15 PM

## 2019-01-07 LAB — CYCLIC CITRUL PEPTIDE ANTIBODY, IGG: Cyclic Citrullin Peptide Ab: <16 UNITS

## 2019-01-07 LAB — ANTI-DNA ANTIBODY, DOUBLE-STRANDED: ds DNA Ab: 15 IU/mL — ABNORMAL HIGH

## 2019-01-07 LAB — RHEUMATOID FACTOR: Rheumatoid fact SerPl-aCnc: <14 IU/mL (ref <14)

## 2019-01-07 NOTE — Progress Notes (Signed)
Double-stranded DNA is elevated but stable.  Rheumatoid factor and anti-CCP antibody which are the test for rheumatoid arthritis were negative.

## 2019-01-09 DIAGNOSIS — M329 Systemic lupus erythematosus, unspecified: Secondary | ICD-10-CM | POA: Diagnosis not present

## 2019-01-09 DIAGNOSIS — Z6821 Body mass index (BMI) 21.0-21.9, adult: Secondary | ICD-10-CM | POA: Diagnosis not present

## 2019-01-09 DIAGNOSIS — Z299 Encounter for prophylactic measures, unspecified: Secondary | ICD-10-CM | POA: Diagnosis not present

## 2019-01-09 DIAGNOSIS — E7800 Pure hypercholesterolemia, unspecified: Secondary | ICD-10-CM | POA: Diagnosis not present

## 2019-01-09 DIAGNOSIS — M545 Low back pain: Secondary | ICD-10-CM | POA: Diagnosis not present

## 2019-01-14 DIAGNOSIS — M5136 Other intervertebral disc degeneration, lumbar region: Secondary | ICD-10-CM | POA: Diagnosis not present

## 2019-01-14 DIAGNOSIS — M4726 Other spondylosis with radiculopathy, lumbar region: Secondary | ICD-10-CM | POA: Diagnosis not present

## 2019-01-14 DIAGNOSIS — M48061 Spinal stenosis, lumbar region without neurogenic claudication: Secondary | ICD-10-CM | POA: Diagnosis not present

## 2019-01-16 NOTE — Progress Notes (Signed)
CBC WNL

## 2019-02-19 DIAGNOSIS — M170 Bilateral primary osteoarthritis of knee: Secondary | ICD-10-CM | POA: Diagnosis not present

## 2019-02-19 DIAGNOSIS — M25561 Pain in right knee: Secondary | ICD-10-CM | POA: Diagnosis not present

## 2019-02-19 DIAGNOSIS — M25562 Pain in left knee: Secondary | ICD-10-CM | POA: Diagnosis not present

## 2019-02-24 DIAGNOSIS — Z79891 Long term (current) use of opiate analgesic: Secondary | ICD-10-CM | POA: Diagnosis not present

## 2019-02-24 DIAGNOSIS — Z5181 Encounter for therapeutic drug level monitoring: Secondary | ICD-10-CM | POA: Diagnosis not present

## 2019-02-24 DIAGNOSIS — M7918 Myalgia, other site: Secondary | ICD-10-CM | POA: Diagnosis not present

## 2019-02-24 DIAGNOSIS — M47816 Spondylosis without myelopathy or radiculopathy, lumbar region: Secondary | ICD-10-CM | POA: Diagnosis not present

## 2019-02-24 DIAGNOSIS — M48062 Spinal stenosis, lumbar region with neurogenic claudication: Secondary | ICD-10-CM | POA: Diagnosis not present

## 2019-03-23 DIAGNOSIS — R197 Diarrhea, unspecified: Secondary | ICD-10-CM | POA: Diagnosis not present

## 2019-03-24 DIAGNOSIS — M545 Low back pain: Secondary | ICD-10-CM | POA: Diagnosis not present

## 2019-03-24 DIAGNOSIS — M7918 Myalgia, other site: Secondary | ICD-10-CM | POA: Diagnosis not present

## 2019-03-24 DIAGNOSIS — M5416 Radiculopathy, lumbar region: Secondary | ICD-10-CM | POA: Diagnosis not present

## 2019-03-24 DIAGNOSIS — M48062 Spinal stenosis, lumbar region with neurogenic claudication: Secondary | ICD-10-CM | POA: Diagnosis not present

## 2019-03-24 DIAGNOSIS — R293 Abnormal posture: Secondary | ICD-10-CM | POA: Diagnosis not present

## 2019-03-24 DIAGNOSIS — M799 Soft tissue disorder, unspecified: Secondary | ICD-10-CM | POA: Diagnosis not present

## 2019-03-24 DIAGNOSIS — M6281 Muscle weakness (generalized): Secondary | ICD-10-CM | POA: Diagnosis not present

## 2019-03-24 DIAGNOSIS — M47816 Spondylosis without myelopathy or radiculopathy, lumbar region: Secondary | ICD-10-CM | POA: Diagnosis not present

## 2019-03-31 DIAGNOSIS — M545 Low back pain: Secondary | ICD-10-CM | POA: Diagnosis not present

## 2019-03-31 DIAGNOSIS — M799 Soft tissue disorder, unspecified: Secondary | ICD-10-CM | POA: Diagnosis not present

## 2019-03-31 DIAGNOSIS — M48062 Spinal stenosis, lumbar region with neurogenic claudication: Secondary | ICD-10-CM | POA: Diagnosis not present

## 2019-03-31 DIAGNOSIS — R293 Abnormal posture: Secondary | ICD-10-CM | POA: Diagnosis not present

## 2019-03-31 DIAGNOSIS — M6281 Muscle weakness (generalized): Secondary | ICD-10-CM | POA: Diagnosis not present

## 2019-03-31 DIAGNOSIS — M7918 Myalgia, other site: Secondary | ICD-10-CM | POA: Diagnosis not present

## 2019-03-31 DIAGNOSIS — M5416 Radiculopathy, lumbar region: Secondary | ICD-10-CM | POA: Diagnosis not present

## 2019-03-31 DIAGNOSIS — M47816 Spondylosis without myelopathy or radiculopathy, lumbar region: Secondary | ICD-10-CM | POA: Diagnosis not present

## 2019-04-01 DIAGNOSIS — Z299 Encounter for prophylactic measures, unspecified: Secondary | ICD-10-CM | POA: Diagnosis not present

## 2019-04-01 DIAGNOSIS — M329 Systemic lupus erythematosus, unspecified: Secondary | ICD-10-CM | POA: Diagnosis not present

## 2019-04-01 DIAGNOSIS — Z6820 Body mass index (BMI) 20.0-20.9, adult: Secondary | ICD-10-CM | POA: Diagnosis not present

## 2019-04-01 DIAGNOSIS — Z789 Other specified health status: Secondary | ICD-10-CM | POA: Diagnosis not present

## 2019-04-01 DIAGNOSIS — R197 Diarrhea, unspecified: Secondary | ICD-10-CM | POA: Diagnosis not present

## 2019-04-02 DIAGNOSIS — M7918 Myalgia, other site: Secondary | ICD-10-CM | POA: Diagnosis not present

## 2019-04-02 DIAGNOSIS — R293 Abnormal posture: Secondary | ICD-10-CM | POA: Diagnosis not present

## 2019-04-02 DIAGNOSIS — M47816 Spondylosis without myelopathy or radiculopathy, lumbar region: Secondary | ICD-10-CM | POA: Diagnosis not present

## 2019-04-02 DIAGNOSIS — M6281 Muscle weakness (generalized): Secondary | ICD-10-CM | POA: Diagnosis not present

## 2019-04-02 DIAGNOSIS — M48062 Spinal stenosis, lumbar region with neurogenic claudication: Secondary | ICD-10-CM | POA: Diagnosis not present

## 2019-04-02 DIAGNOSIS — M799 Soft tissue disorder, unspecified: Secondary | ICD-10-CM | POA: Diagnosis not present

## 2019-04-02 DIAGNOSIS — M5416 Radiculopathy, lumbar region: Secondary | ICD-10-CM | POA: Diagnosis not present

## 2019-04-02 DIAGNOSIS — M545 Low back pain: Secondary | ICD-10-CM | POA: Diagnosis not present

## 2019-04-07 DIAGNOSIS — R293 Abnormal posture: Secondary | ICD-10-CM | POA: Diagnosis not present

## 2019-04-07 DIAGNOSIS — M47816 Spondylosis without myelopathy or radiculopathy, lumbar region: Secondary | ICD-10-CM | POA: Diagnosis not present

## 2019-04-07 DIAGNOSIS — M6281 Muscle weakness (generalized): Secondary | ICD-10-CM | POA: Diagnosis not present

## 2019-04-07 DIAGNOSIS — M48062 Spinal stenosis, lumbar region with neurogenic claudication: Secondary | ICD-10-CM | POA: Diagnosis not present

## 2019-04-07 DIAGNOSIS — M5416 Radiculopathy, lumbar region: Secondary | ICD-10-CM | POA: Diagnosis not present

## 2019-04-07 DIAGNOSIS — M799 Soft tissue disorder, unspecified: Secondary | ICD-10-CM | POA: Diagnosis not present

## 2019-04-07 DIAGNOSIS — M545 Low back pain: Secondary | ICD-10-CM | POA: Diagnosis not present

## 2019-04-07 DIAGNOSIS — M7918 Myalgia, other site: Secondary | ICD-10-CM | POA: Diagnosis not present

## 2019-04-09 DIAGNOSIS — M48062 Spinal stenosis, lumbar region with neurogenic claudication: Secondary | ICD-10-CM | POA: Diagnosis not present

## 2019-04-09 DIAGNOSIS — M5416 Radiculopathy, lumbar region: Secondary | ICD-10-CM | POA: Diagnosis not present

## 2019-04-09 DIAGNOSIS — M47816 Spondylosis without myelopathy or radiculopathy, lumbar region: Secondary | ICD-10-CM | POA: Diagnosis not present

## 2019-04-09 DIAGNOSIS — M6281 Muscle weakness (generalized): Secondary | ICD-10-CM | POA: Diagnosis not present

## 2019-04-09 DIAGNOSIS — M7918 Myalgia, other site: Secondary | ICD-10-CM | POA: Diagnosis not present

## 2019-04-09 DIAGNOSIS — M799 Soft tissue disorder, unspecified: Secondary | ICD-10-CM | POA: Diagnosis not present

## 2019-04-09 DIAGNOSIS — R293 Abnormal posture: Secondary | ICD-10-CM | POA: Diagnosis not present

## 2019-04-09 DIAGNOSIS — M545 Low back pain: Secondary | ICD-10-CM | POA: Diagnosis not present

## 2019-04-14 DIAGNOSIS — M7918 Myalgia, other site: Secondary | ICD-10-CM | POA: Diagnosis not present

## 2019-04-14 DIAGNOSIS — M799 Soft tissue disorder, unspecified: Secondary | ICD-10-CM | POA: Diagnosis not present

## 2019-04-14 DIAGNOSIS — M48062 Spinal stenosis, lumbar region with neurogenic claudication: Secondary | ICD-10-CM | POA: Diagnosis not present

## 2019-04-14 DIAGNOSIS — M47816 Spondylosis without myelopathy or radiculopathy, lumbar region: Secondary | ICD-10-CM | POA: Diagnosis not present

## 2019-04-14 DIAGNOSIS — M5416 Radiculopathy, lumbar region: Secondary | ICD-10-CM | POA: Diagnosis not present

## 2019-04-14 DIAGNOSIS — M545 Low back pain: Secondary | ICD-10-CM | POA: Diagnosis not present

## 2019-04-14 DIAGNOSIS — M6281 Muscle weakness (generalized): Secondary | ICD-10-CM | POA: Diagnosis not present

## 2019-04-14 DIAGNOSIS — R293 Abnormal posture: Secondary | ICD-10-CM | POA: Diagnosis not present

## 2019-04-16 ENCOUNTER — Ambulatory Visit: Payer: Self-pay

## 2019-04-16 DIAGNOSIS — M545 Low back pain: Secondary | ICD-10-CM | POA: Diagnosis not present

## 2019-04-16 DIAGNOSIS — M7918 Myalgia, other site: Secondary | ICD-10-CM | POA: Diagnosis not present

## 2019-04-16 DIAGNOSIS — M47816 Spondylosis without myelopathy or radiculopathy, lumbar region: Secondary | ICD-10-CM | POA: Diagnosis not present

## 2019-04-16 DIAGNOSIS — M799 Soft tissue disorder, unspecified: Secondary | ICD-10-CM | POA: Diagnosis not present

## 2019-04-16 DIAGNOSIS — M5416 Radiculopathy, lumbar region: Secondary | ICD-10-CM | POA: Diagnosis not present

## 2019-04-16 DIAGNOSIS — R293 Abnormal posture: Secondary | ICD-10-CM | POA: Diagnosis not present

## 2019-04-16 DIAGNOSIS — M6281 Muscle weakness (generalized): Secondary | ICD-10-CM | POA: Diagnosis not present

## 2019-04-16 DIAGNOSIS — M48062 Spinal stenosis, lumbar region with neurogenic claudication: Secondary | ICD-10-CM | POA: Diagnosis not present

## 2019-04-19 DIAGNOSIS — G459 Transient cerebral ischemic attack, unspecified: Secondary | ICD-10-CM | POA: Diagnosis not present

## 2019-04-19 DIAGNOSIS — G8929 Other chronic pain: Secondary | ICD-10-CM | POA: Diagnosis not present

## 2019-04-19 DIAGNOSIS — K219 Gastro-esophageal reflux disease without esophagitis: Secondary | ICD-10-CM | POA: Diagnosis not present

## 2019-04-19 DIAGNOSIS — I639 Cerebral infarction, unspecified: Secondary | ICD-10-CM

## 2019-04-19 DIAGNOSIS — Z79899 Other long term (current) drug therapy: Secondary | ICD-10-CM | POA: Diagnosis not present

## 2019-04-19 DIAGNOSIS — R200 Anesthesia of skin: Secondary | ICD-10-CM | POA: Diagnosis not present

## 2019-04-19 DIAGNOSIS — Z20822 Contact with and (suspected) exposure to covid-19: Secondary | ICD-10-CM | POA: Diagnosis not present

## 2019-04-19 DIAGNOSIS — Z7982 Long term (current) use of aspirin: Secondary | ICD-10-CM | POA: Diagnosis not present

## 2019-04-19 DIAGNOSIS — M8580 Other specified disorders of bone density and structure, unspecified site: Secondary | ICD-10-CM | POA: Diagnosis not present

## 2019-04-19 DIAGNOSIS — R52 Pain, unspecified: Secondary | ICD-10-CM | POA: Diagnosis not present

## 2019-04-19 DIAGNOSIS — M5489 Other dorsalgia: Secondary | ICD-10-CM | POA: Diagnosis not present

## 2019-04-19 DIAGNOSIS — M329 Systemic lupus erythematosus, unspecified: Secondary | ICD-10-CM | POA: Diagnosis not present

## 2019-04-19 DIAGNOSIS — B0229 Other postherpetic nervous system involvement: Secondary | ICD-10-CM | POA: Diagnosis not present

## 2019-04-19 DIAGNOSIS — E7800 Pure hypercholesterolemia, unspecified: Secondary | ICD-10-CM | POA: Diagnosis not present

## 2019-04-19 DIAGNOSIS — R531 Weakness: Secondary | ICD-10-CM | POA: Diagnosis not present

## 2019-04-19 DIAGNOSIS — I6781 Acute cerebrovascular insufficiency: Secondary | ICD-10-CM | POA: Diagnosis not present

## 2019-04-19 DIAGNOSIS — F419 Anxiety disorder, unspecified: Secondary | ICD-10-CM | POA: Diagnosis not present

## 2019-04-19 DIAGNOSIS — G8194 Hemiplegia, unspecified affecting left nondominant side: Secondary | ICD-10-CM | POA: Diagnosis not present

## 2019-04-19 DIAGNOSIS — Z7983 Long term (current) use of bisphosphonates: Secondary | ICD-10-CM | POA: Diagnosis not present

## 2019-04-19 DIAGNOSIS — I6381 Other cerebral infarction due to occlusion or stenosis of small artery: Secondary | ICD-10-CM | POA: Diagnosis not present

## 2019-04-19 HISTORY — DX: Cerebral infarction, unspecified: I63.9

## 2019-04-20 DIAGNOSIS — I69354 Hemiplegia and hemiparesis following cerebral infarction affecting left non-dominant side: Secondary | ICD-10-CM | POA: Diagnosis not present

## 2019-04-20 DIAGNOSIS — Z5189 Encounter for other specified aftercare: Secondary | ICD-10-CM | POA: Diagnosis not present

## 2019-04-20 DIAGNOSIS — I639 Cerebral infarction, unspecified: Secondary | ICD-10-CM | POA: Diagnosis not present

## 2019-04-20 DIAGNOSIS — Z7902 Long term (current) use of antithrombotics/antiplatelets: Secondary | ICD-10-CM | POA: Diagnosis not present

## 2019-04-20 DIAGNOSIS — R9089 Other abnormal findings on diagnostic imaging of central nervous system: Secondary | ICD-10-CM | POA: Diagnosis not present

## 2019-04-21 DIAGNOSIS — Z5189 Encounter for other specified aftercare: Secondary | ICD-10-CM | POA: Diagnosis not present

## 2019-04-21 DIAGNOSIS — I69354 Hemiplegia and hemiparesis following cerebral infarction affecting left non-dominant side: Secondary | ICD-10-CM | POA: Diagnosis not present

## 2019-04-21 DIAGNOSIS — R9089 Other abnormal findings on diagnostic imaging of central nervous system: Secondary | ICD-10-CM | POA: Diagnosis not present

## 2019-04-21 DIAGNOSIS — Z7902 Long term (current) use of antithrombotics/antiplatelets: Secondary | ICD-10-CM | POA: Diagnosis not present

## 2019-04-23 DIAGNOSIS — Z23 Encounter for immunization: Secondary | ICD-10-CM | POA: Diagnosis not present

## 2019-04-25 ENCOUNTER — Ambulatory Visit: Payer: Medicare Other

## 2019-04-27 ENCOUNTER — Ambulatory Visit: Payer: Self-pay

## 2019-04-27 DIAGNOSIS — M6281 Muscle weakness (generalized): Secondary | ICD-10-CM | POA: Diagnosis not present

## 2019-04-27 DIAGNOSIS — R262 Difficulty in walking, not elsewhere classified: Secondary | ICD-10-CM | POA: Diagnosis not present

## 2019-04-27 DIAGNOSIS — M799 Soft tissue disorder, unspecified: Secondary | ICD-10-CM | POA: Diagnosis not present

## 2019-04-27 DIAGNOSIS — M1990 Unspecified osteoarthritis, unspecified site: Secondary | ICD-10-CM | POA: Diagnosis not present

## 2019-04-27 DIAGNOSIS — I639 Cerebral infarction, unspecified: Secondary | ICD-10-CM | POA: Diagnosis not present

## 2019-04-27 DIAGNOSIS — M8580 Other specified disorders of bone density and structure, unspecified site: Secondary | ICD-10-CM | POA: Diagnosis not present

## 2019-04-27 DIAGNOSIS — R269 Unspecified abnormalities of gait and mobility: Secondary | ICD-10-CM | POA: Diagnosis not present

## 2019-04-27 DIAGNOSIS — R293 Abnormal posture: Secondary | ICD-10-CM | POA: Diagnosis not present

## 2019-04-28 DIAGNOSIS — I10 Essential (primary) hypertension: Secondary | ICD-10-CM | POA: Diagnosis not present

## 2019-04-28 DIAGNOSIS — I69359 Hemiplegia and hemiparesis following cerebral infarction affecting unspecified side: Secondary | ICD-10-CM | POA: Diagnosis not present

## 2019-04-28 DIAGNOSIS — Z6820 Body mass index (BMI) 20.0-20.9, adult: Secondary | ICD-10-CM | POA: Diagnosis not present

## 2019-04-28 DIAGNOSIS — G4700 Insomnia, unspecified: Secondary | ICD-10-CM | POA: Diagnosis not present

## 2019-04-28 DIAGNOSIS — Z789 Other specified health status: Secondary | ICD-10-CM | POA: Diagnosis not present

## 2019-04-28 DIAGNOSIS — Z299 Encounter for prophylactic measures, unspecified: Secondary | ICD-10-CM | POA: Diagnosis not present

## 2019-04-29 DIAGNOSIS — R278 Other lack of coordination: Secondary | ICD-10-CM | POA: Diagnosis not present

## 2019-04-29 DIAGNOSIS — R202 Paresthesia of skin: Secondary | ICD-10-CM | POA: Diagnosis not present

## 2019-04-29 DIAGNOSIS — M6281 Muscle weakness (generalized): Secondary | ICD-10-CM | POA: Diagnosis not present

## 2019-04-29 DIAGNOSIS — I639 Cerebral infarction, unspecified: Secondary | ICD-10-CM | POA: Diagnosis not present

## 2019-04-30 DIAGNOSIS — R293 Abnormal posture: Secondary | ICD-10-CM | POA: Diagnosis not present

## 2019-04-30 DIAGNOSIS — I639 Cerebral infarction, unspecified: Secondary | ICD-10-CM | POA: Diagnosis not present

## 2019-04-30 DIAGNOSIS — M1990 Unspecified osteoarthritis, unspecified site: Secondary | ICD-10-CM | POA: Diagnosis not present

## 2019-04-30 DIAGNOSIS — R269 Unspecified abnormalities of gait and mobility: Secondary | ICD-10-CM | POA: Diagnosis not present

## 2019-04-30 DIAGNOSIS — R262 Difficulty in walking, not elsewhere classified: Secondary | ICD-10-CM | POA: Diagnosis not present

## 2019-04-30 DIAGNOSIS — M8580 Other specified disorders of bone density and structure, unspecified site: Secondary | ICD-10-CM | POA: Diagnosis not present

## 2019-04-30 DIAGNOSIS — M6281 Muscle weakness (generalized): Secondary | ICD-10-CM | POA: Diagnosis not present

## 2019-04-30 DIAGNOSIS — M799 Soft tissue disorder, unspecified: Secondary | ICD-10-CM | POA: Diagnosis not present

## 2019-05-04 DIAGNOSIS — R269 Unspecified abnormalities of gait and mobility: Secondary | ICD-10-CM | POA: Diagnosis not present

## 2019-05-04 DIAGNOSIS — I639 Cerebral infarction, unspecified: Secondary | ICD-10-CM | POA: Diagnosis not present

## 2019-05-04 DIAGNOSIS — M8580 Other specified disorders of bone density and structure, unspecified site: Secondary | ICD-10-CM | POA: Diagnosis not present

## 2019-05-04 DIAGNOSIS — M799 Soft tissue disorder, unspecified: Secondary | ICD-10-CM | POA: Diagnosis not present

## 2019-05-04 DIAGNOSIS — R293 Abnormal posture: Secondary | ICD-10-CM | POA: Diagnosis not present

## 2019-05-04 DIAGNOSIS — R262 Difficulty in walking, not elsewhere classified: Secondary | ICD-10-CM | POA: Diagnosis not present

## 2019-05-04 DIAGNOSIS — R278 Other lack of coordination: Secondary | ICD-10-CM | POA: Diagnosis not present

## 2019-05-04 DIAGNOSIS — M1990 Unspecified osteoarthritis, unspecified site: Secondary | ICD-10-CM | POA: Diagnosis not present

## 2019-05-04 DIAGNOSIS — R202 Paresthesia of skin: Secondary | ICD-10-CM | POA: Diagnosis not present

## 2019-05-04 DIAGNOSIS — I6350 Cerebral infarction due to unspecified occlusion or stenosis of unspecified cerebral artery: Secondary | ICD-10-CM | POA: Diagnosis not present

## 2019-05-04 DIAGNOSIS — M6281 Muscle weakness (generalized): Secondary | ICD-10-CM | POA: Diagnosis not present

## 2019-05-06 DIAGNOSIS — R278 Other lack of coordination: Secondary | ICD-10-CM | POA: Diagnosis not present

## 2019-05-06 DIAGNOSIS — R202 Paresthesia of skin: Secondary | ICD-10-CM | POA: Diagnosis not present

## 2019-05-06 DIAGNOSIS — M6281 Muscle weakness (generalized): Secondary | ICD-10-CM | POA: Diagnosis not present

## 2019-05-06 DIAGNOSIS — I639 Cerebral infarction, unspecified: Secondary | ICD-10-CM | POA: Diagnosis not present

## 2019-05-11 DIAGNOSIS — R278 Other lack of coordination: Secondary | ICD-10-CM | POA: Diagnosis not present

## 2019-05-11 DIAGNOSIS — R202 Paresthesia of skin: Secondary | ICD-10-CM | POA: Diagnosis not present

## 2019-05-11 DIAGNOSIS — M6281 Muscle weakness (generalized): Secondary | ICD-10-CM | POA: Diagnosis not present

## 2019-05-11 DIAGNOSIS — I639 Cerebral infarction, unspecified: Secondary | ICD-10-CM | POA: Diagnosis not present

## 2019-05-12 DIAGNOSIS — M8580 Other specified disorders of bone density and structure, unspecified site: Secondary | ICD-10-CM | POA: Diagnosis not present

## 2019-05-12 DIAGNOSIS — M1990 Unspecified osteoarthritis, unspecified site: Secondary | ICD-10-CM | POA: Diagnosis not present

## 2019-05-12 DIAGNOSIS — R293 Abnormal posture: Secondary | ICD-10-CM | POA: Diagnosis not present

## 2019-05-12 DIAGNOSIS — I639 Cerebral infarction, unspecified: Secondary | ICD-10-CM | POA: Diagnosis not present

## 2019-05-12 DIAGNOSIS — R269 Unspecified abnormalities of gait and mobility: Secondary | ICD-10-CM | POA: Diagnosis not present

## 2019-05-12 DIAGNOSIS — M799 Soft tissue disorder, unspecified: Secondary | ICD-10-CM | POA: Diagnosis not present

## 2019-05-12 DIAGNOSIS — M6281 Muscle weakness (generalized): Secondary | ICD-10-CM | POA: Diagnosis not present

## 2019-05-12 DIAGNOSIS — R262 Difficulty in walking, not elsewhere classified: Secondary | ICD-10-CM | POA: Diagnosis not present

## 2019-05-13 DIAGNOSIS — R278 Other lack of coordination: Secondary | ICD-10-CM | POA: Diagnosis not present

## 2019-05-13 DIAGNOSIS — I639 Cerebral infarction, unspecified: Secondary | ICD-10-CM | POA: Diagnosis not present

## 2019-05-13 DIAGNOSIS — R202 Paresthesia of skin: Secondary | ICD-10-CM | POA: Diagnosis not present

## 2019-05-13 DIAGNOSIS — M6281 Muscle weakness (generalized): Secondary | ICD-10-CM | POA: Diagnosis not present

## 2019-05-14 DIAGNOSIS — M6281 Muscle weakness (generalized): Secondary | ICD-10-CM | POA: Diagnosis not present

## 2019-05-14 DIAGNOSIS — R262 Difficulty in walking, not elsewhere classified: Secondary | ICD-10-CM | POA: Diagnosis not present

## 2019-05-14 DIAGNOSIS — M799 Soft tissue disorder, unspecified: Secondary | ICD-10-CM | POA: Diagnosis not present

## 2019-05-14 DIAGNOSIS — M1990 Unspecified osteoarthritis, unspecified site: Secondary | ICD-10-CM | POA: Diagnosis not present

## 2019-05-14 DIAGNOSIS — R293 Abnormal posture: Secondary | ICD-10-CM | POA: Diagnosis not present

## 2019-05-14 DIAGNOSIS — M8580 Other specified disorders of bone density and structure, unspecified site: Secondary | ICD-10-CM | POA: Diagnosis not present

## 2019-05-14 DIAGNOSIS — R269 Unspecified abnormalities of gait and mobility: Secondary | ICD-10-CM | POA: Diagnosis not present

## 2019-05-14 DIAGNOSIS — I639 Cerebral infarction, unspecified: Secondary | ICD-10-CM | POA: Diagnosis not present

## 2019-05-18 DIAGNOSIS — M6281 Muscle weakness (generalized): Secondary | ICD-10-CM | POA: Diagnosis not present

## 2019-05-18 DIAGNOSIS — R278 Other lack of coordination: Secondary | ICD-10-CM | POA: Diagnosis not present

## 2019-05-18 DIAGNOSIS — I639 Cerebral infarction, unspecified: Secondary | ICD-10-CM | POA: Diagnosis not present

## 2019-05-18 DIAGNOSIS — R202 Paresthesia of skin: Secondary | ICD-10-CM | POA: Diagnosis not present

## 2019-05-19 DIAGNOSIS — M8580 Other specified disorders of bone density and structure, unspecified site: Secondary | ICD-10-CM | POA: Diagnosis not present

## 2019-05-19 DIAGNOSIS — M799 Soft tissue disorder, unspecified: Secondary | ICD-10-CM | POA: Diagnosis not present

## 2019-05-19 DIAGNOSIS — R269 Unspecified abnormalities of gait and mobility: Secondary | ICD-10-CM | POA: Diagnosis not present

## 2019-05-19 DIAGNOSIS — R262 Difficulty in walking, not elsewhere classified: Secondary | ICD-10-CM | POA: Diagnosis not present

## 2019-05-19 DIAGNOSIS — M6281 Muscle weakness (generalized): Secondary | ICD-10-CM | POA: Diagnosis not present

## 2019-05-19 DIAGNOSIS — I639 Cerebral infarction, unspecified: Secondary | ICD-10-CM | POA: Diagnosis not present

## 2019-05-19 DIAGNOSIS — M1990 Unspecified osteoarthritis, unspecified site: Secondary | ICD-10-CM | POA: Diagnosis not present

## 2019-05-19 DIAGNOSIS — R293 Abnormal posture: Secondary | ICD-10-CM | POA: Diagnosis not present

## 2019-05-20 DIAGNOSIS — R202 Paresthesia of skin: Secondary | ICD-10-CM | POA: Diagnosis not present

## 2019-05-20 DIAGNOSIS — I639 Cerebral infarction, unspecified: Secondary | ICD-10-CM | POA: Diagnosis not present

## 2019-05-20 DIAGNOSIS — R278 Other lack of coordination: Secondary | ICD-10-CM | POA: Diagnosis not present

## 2019-05-20 DIAGNOSIS — M6281 Muscle weakness (generalized): Secondary | ICD-10-CM | POA: Diagnosis not present

## 2019-05-21 DIAGNOSIS — Z299 Encounter for prophylactic measures, unspecified: Secondary | ICD-10-CM | POA: Diagnosis not present

## 2019-05-21 DIAGNOSIS — I10 Essential (primary) hypertension: Secondary | ICD-10-CM | POA: Diagnosis not present

## 2019-05-21 DIAGNOSIS — E7800 Pure hypercholesterolemia, unspecified: Secondary | ICD-10-CM | POA: Diagnosis not present

## 2019-05-21 DIAGNOSIS — Z789 Other specified health status: Secondary | ICD-10-CM | POA: Diagnosis not present

## 2019-05-21 DIAGNOSIS — K219 Gastro-esophageal reflux disease without esophagitis: Secondary | ICD-10-CM | POA: Diagnosis not present

## 2019-05-21 DIAGNOSIS — I69359 Hemiplegia and hemiparesis following cerebral infarction affecting unspecified side: Secondary | ICD-10-CM | POA: Diagnosis not present

## 2019-05-21 DIAGNOSIS — Z6820 Body mass index (BMI) 20.0-20.9, adult: Secondary | ICD-10-CM | POA: Diagnosis not present

## 2019-05-22 DIAGNOSIS — Z23 Encounter for immunization: Secondary | ICD-10-CM | POA: Diagnosis not present

## 2019-05-25 DIAGNOSIS — R202 Paresthesia of skin: Secondary | ICD-10-CM | POA: Diagnosis not present

## 2019-05-25 DIAGNOSIS — M6281 Muscle weakness (generalized): Secondary | ICD-10-CM | POA: Diagnosis not present

## 2019-05-25 DIAGNOSIS — R278 Other lack of coordination: Secondary | ICD-10-CM | POA: Diagnosis not present

## 2019-05-25 DIAGNOSIS — I639 Cerebral infarction, unspecified: Secondary | ICD-10-CM | POA: Diagnosis not present

## 2019-05-26 DIAGNOSIS — M1990 Unspecified osteoarthritis, unspecified site: Secondary | ICD-10-CM | POA: Diagnosis not present

## 2019-05-26 DIAGNOSIS — I639 Cerebral infarction, unspecified: Secondary | ICD-10-CM | POA: Diagnosis not present

## 2019-05-26 DIAGNOSIS — R262 Difficulty in walking, not elsewhere classified: Secondary | ICD-10-CM | POA: Diagnosis not present

## 2019-05-26 DIAGNOSIS — R293 Abnormal posture: Secondary | ICD-10-CM | POA: Diagnosis not present

## 2019-05-26 DIAGNOSIS — M799 Soft tissue disorder, unspecified: Secondary | ICD-10-CM | POA: Diagnosis not present

## 2019-05-26 DIAGNOSIS — M8580 Other specified disorders of bone density and structure, unspecified site: Secondary | ICD-10-CM | POA: Diagnosis not present

## 2019-05-26 DIAGNOSIS — M6281 Muscle weakness (generalized): Secondary | ICD-10-CM | POA: Diagnosis not present

## 2019-05-26 DIAGNOSIS — R269 Unspecified abnormalities of gait and mobility: Secondary | ICD-10-CM | POA: Diagnosis not present

## 2019-05-28 DIAGNOSIS — M1990 Unspecified osteoarthritis, unspecified site: Secondary | ICD-10-CM | POA: Diagnosis not present

## 2019-05-28 DIAGNOSIS — M799 Soft tissue disorder, unspecified: Secondary | ICD-10-CM | POA: Diagnosis not present

## 2019-05-28 DIAGNOSIS — R262 Difficulty in walking, not elsewhere classified: Secondary | ICD-10-CM | POA: Diagnosis not present

## 2019-05-28 DIAGNOSIS — M8580 Other specified disorders of bone density and structure, unspecified site: Secondary | ICD-10-CM | POA: Diagnosis not present

## 2019-05-28 DIAGNOSIS — I639 Cerebral infarction, unspecified: Secondary | ICD-10-CM | POA: Diagnosis not present

## 2019-05-28 DIAGNOSIS — M6281 Muscle weakness (generalized): Secondary | ICD-10-CM | POA: Diagnosis not present

## 2019-05-28 DIAGNOSIS — R293 Abnormal posture: Secondary | ICD-10-CM | POA: Diagnosis not present

## 2019-05-28 DIAGNOSIS — R269 Unspecified abnormalities of gait and mobility: Secondary | ICD-10-CM | POA: Diagnosis not present

## 2019-05-29 DIAGNOSIS — R278 Other lack of coordination: Secondary | ICD-10-CM | POA: Diagnosis not present

## 2019-05-29 DIAGNOSIS — I639 Cerebral infarction, unspecified: Secondary | ICD-10-CM | POA: Diagnosis not present

## 2019-05-29 DIAGNOSIS — R202 Paresthesia of skin: Secondary | ICD-10-CM | POA: Diagnosis not present

## 2019-05-29 DIAGNOSIS — M6281 Muscle weakness (generalized): Secondary | ICD-10-CM | POA: Diagnosis not present

## 2019-06-02 DIAGNOSIS — M5136 Other intervertebral disc degeneration, lumbar region: Secondary | ICD-10-CM | POA: Diagnosis not present

## 2019-06-02 DIAGNOSIS — M25561 Pain in right knee: Secondary | ICD-10-CM | POA: Diagnosis not present

## 2019-06-02 DIAGNOSIS — Z79891 Long term (current) use of opiate analgesic: Secondary | ICD-10-CM | POA: Diagnosis not present

## 2019-06-02 DIAGNOSIS — M48062 Spinal stenosis, lumbar region with neurogenic claudication: Secondary | ICD-10-CM | POA: Diagnosis not present

## 2019-06-02 DIAGNOSIS — Z5181 Encounter for therapeutic drug level monitoring: Secondary | ICD-10-CM | POA: Diagnosis not present

## 2019-06-02 DIAGNOSIS — G8929 Other chronic pain: Secondary | ICD-10-CM | POA: Diagnosis not present

## 2019-06-02 DIAGNOSIS — M47816 Spondylosis without myelopathy or radiculopathy, lumbar region: Secondary | ICD-10-CM | POA: Diagnosis not present

## 2019-06-03 DIAGNOSIS — M6281 Muscle weakness (generalized): Secondary | ICD-10-CM | POA: Diagnosis not present

## 2019-06-03 DIAGNOSIS — R202 Paresthesia of skin: Secondary | ICD-10-CM | POA: Diagnosis not present

## 2019-06-03 DIAGNOSIS — M8580 Other specified disorders of bone density and structure, unspecified site: Secondary | ICD-10-CM | POA: Diagnosis not present

## 2019-06-03 DIAGNOSIS — M1990 Unspecified osteoarthritis, unspecified site: Secondary | ICD-10-CM | POA: Diagnosis not present

## 2019-06-03 DIAGNOSIS — R293 Abnormal posture: Secondary | ICD-10-CM | POA: Diagnosis not present

## 2019-06-03 DIAGNOSIS — R269 Unspecified abnormalities of gait and mobility: Secondary | ICD-10-CM | POA: Diagnosis not present

## 2019-06-03 DIAGNOSIS — I639 Cerebral infarction, unspecified: Secondary | ICD-10-CM | POA: Diagnosis not present

## 2019-06-03 DIAGNOSIS — R278 Other lack of coordination: Secondary | ICD-10-CM | POA: Diagnosis not present

## 2019-06-03 DIAGNOSIS — R262 Difficulty in walking, not elsewhere classified: Secondary | ICD-10-CM | POA: Diagnosis not present

## 2019-06-03 DIAGNOSIS — M799 Soft tissue disorder, unspecified: Secondary | ICD-10-CM | POA: Diagnosis not present

## 2019-06-05 DIAGNOSIS — M8580 Other specified disorders of bone density and structure, unspecified site: Secondary | ICD-10-CM | POA: Diagnosis not present

## 2019-06-05 DIAGNOSIS — M6281 Muscle weakness (generalized): Secondary | ICD-10-CM | POA: Diagnosis not present

## 2019-06-05 DIAGNOSIS — R293 Abnormal posture: Secondary | ICD-10-CM | POA: Diagnosis not present

## 2019-06-05 DIAGNOSIS — M1990 Unspecified osteoarthritis, unspecified site: Secondary | ICD-10-CM | POA: Diagnosis not present

## 2019-06-05 DIAGNOSIS — I639 Cerebral infarction, unspecified: Secondary | ICD-10-CM | POA: Diagnosis not present

## 2019-06-05 DIAGNOSIS — R269 Unspecified abnormalities of gait and mobility: Secondary | ICD-10-CM | POA: Diagnosis not present

## 2019-06-05 DIAGNOSIS — M799 Soft tissue disorder, unspecified: Secondary | ICD-10-CM | POA: Diagnosis not present

## 2019-06-05 DIAGNOSIS — R262 Difficulty in walking, not elsewhere classified: Secondary | ICD-10-CM | POA: Diagnosis not present

## 2019-06-08 DIAGNOSIS — M1990 Unspecified osteoarthritis, unspecified site: Secondary | ICD-10-CM | POA: Diagnosis not present

## 2019-06-08 DIAGNOSIS — R269 Unspecified abnormalities of gait and mobility: Secondary | ICD-10-CM | POA: Diagnosis not present

## 2019-06-08 DIAGNOSIS — R293 Abnormal posture: Secondary | ICD-10-CM | POA: Diagnosis not present

## 2019-06-08 DIAGNOSIS — M799 Soft tissue disorder, unspecified: Secondary | ICD-10-CM | POA: Diagnosis not present

## 2019-06-08 DIAGNOSIS — M6281 Muscle weakness (generalized): Secondary | ICD-10-CM | POA: Diagnosis not present

## 2019-06-08 DIAGNOSIS — R262 Difficulty in walking, not elsewhere classified: Secondary | ICD-10-CM | POA: Diagnosis not present

## 2019-06-08 DIAGNOSIS — M8580 Other specified disorders of bone density and structure, unspecified site: Secondary | ICD-10-CM | POA: Diagnosis not present

## 2019-06-08 DIAGNOSIS — I639 Cerebral infarction, unspecified: Secondary | ICD-10-CM | POA: Diagnosis not present

## 2019-06-10 DIAGNOSIS — M6281 Muscle weakness (generalized): Secondary | ICD-10-CM | POA: Diagnosis not present

## 2019-06-10 DIAGNOSIS — I639 Cerebral infarction, unspecified: Secondary | ICD-10-CM | POA: Diagnosis not present

## 2019-06-10 DIAGNOSIS — R202 Paresthesia of skin: Secondary | ICD-10-CM | POA: Diagnosis not present

## 2019-06-10 DIAGNOSIS — R278 Other lack of coordination: Secondary | ICD-10-CM | POA: Diagnosis not present

## 2019-06-11 DIAGNOSIS — R262 Difficulty in walking, not elsewhere classified: Secondary | ICD-10-CM | POA: Diagnosis not present

## 2019-06-11 DIAGNOSIS — M799 Soft tissue disorder, unspecified: Secondary | ICD-10-CM | POA: Diagnosis not present

## 2019-06-11 DIAGNOSIS — M1990 Unspecified osteoarthritis, unspecified site: Secondary | ICD-10-CM | POA: Diagnosis not present

## 2019-06-11 DIAGNOSIS — M8580 Other specified disorders of bone density and structure, unspecified site: Secondary | ICD-10-CM | POA: Diagnosis not present

## 2019-06-11 DIAGNOSIS — I639 Cerebral infarction, unspecified: Secondary | ICD-10-CM | POA: Diagnosis not present

## 2019-06-11 DIAGNOSIS — R293 Abnormal posture: Secondary | ICD-10-CM | POA: Diagnosis not present

## 2019-06-11 DIAGNOSIS — R269 Unspecified abnormalities of gait and mobility: Secondary | ICD-10-CM | POA: Diagnosis not present

## 2019-06-11 DIAGNOSIS — M6281 Muscle weakness (generalized): Secondary | ICD-10-CM | POA: Diagnosis not present

## 2019-06-11 NOTE — Progress Notes (Signed)
Office Visit Note  Patient: Sheila Oliver             Date of Birth: March 07, 1941           MRN: [id number]             PCP: Monico Blitz, MD Referring: Monico Blitz, MD Visit Date: 06/18/2019 Occupation: @GUAROCC @  Subjective:  Pain in both shoulders   History of Present Illness: Sheila Oliver is a 79 y.o. female with history of positive ANA and osteoarthritis.  Patient reports that she had a stroke on 04/19/2019 and was hospitalized during that time.  She states that she was started on Plavix.  She has been going to physical therapy and occupational therapy 4 times a week.  She states that while hospitalized she had an exacerbation of lower back pain due to sleeping in a hospital bed.  She continues to have chronic pain and stiffness in her neck and lower back.  She has been having increased pain in both shoulder joints.  She experiences nocturnal pain when laying on her sides at night.  She states that she is experiencing muscle tenderness in her upper extremities.  She has also been experiencing increased fatigue.  She denies any recent rashes, oral or nasal ulcerations, enlarged lymph nodes, or symptoms of Raynaud's.  She has chronic eye dryness and uses Systane eyedrops.  Activities of Daily Living:  Patient reports morning stiffness for 0 minutes.   Patient Reports nocturnal pain.  Difficulty dressing/grooming: Reports Difficulty climbing stairs: Denies Difficulty getting out of chair: Denies Difficulty using hands for taps, buttons, cutlery, and/or writing: Reports  Review of Systems  Constitutional: Positive for fatigue.  HENT: Negative for mouth sores, mouth dryness and nose dryness.   Eyes: Negative for pain, itching, visual disturbance and dryness.  Respiratory: Negative for cough, hemoptysis, shortness of breath and difficulty breathing.   Cardiovascular: Negative for chest pain, palpitations, hypertension and swelling in legs/feet.  Gastrointestinal: Negative for blood in  stool, constipation and diarrhea.  Endocrine: Negative for increased urination.  Genitourinary: Negative for difficulty urinating and painful urination.  Musculoskeletal: Positive for arthralgias, joint pain, joint swelling, myalgias, muscle weakness, morning stiffness, muscle tenderness and myalgias.  Skin: Negative for color change, pallor, rash, hair loss, nodules/bumps, redness, skin tightness, ulcers and sensitivity to sunlight.  Allergic/Immunologic: Negative for susceptible to infections.  Neurological: Positive for weakness. Negative for dizziness, headaches and memory loss.  Hematological: Positive for bruising/bleeding tendency. Negative for swollen glands.  Psychiatric/Behavioral: Positive for sleep disturbance. Negative for depressed mood and confusion. The patient is not nervous/anxious.     PMFS History:  Patient Active Problem List   Diagnosis Date Noted  . External hemorrhoid 09/04/2017  . Chronic diarrhea 06/20/2016  . Osteopenia of multiple sites 05/15/2016  . Chronic pain syndrome 05/15/2016  . Primary insomnia 05/15/2016  . History of gastroesophageal reflux (GERD) 05/15/2016  . ANA positive 05/07/2016  . DJD (degenerative joint disease), cervical 05/07/2016  . Spondylosis of lumbar region without myelopathy or radiculopathy 05/07/2016  . Primary osteoarthritis of both hands 05/07/2016    Past Medical History:  Diagnosis Date  . C. difficile diarrhea   . DDD (degenerative disc disease), lumbar   . GERD (gastroesophageal reflux disease)   . Osteoporosis   . Scoliosis   . Stroke [facility]) 04/19/2019    History reviewed. No pertinent family history. Past Surgical History:  Procedure Laterality Date  . CATARACT EXTRACTION Left   . CERVICAL FUSION    .  COLONOSCOPY WITH PROPOFOL N/A 05/31/2016   Dr. Gala Romney: sigmoid and descending colon diverticulosis, normal colonic biopsies  . ESOPHAGOGASTRODUODENOSCOPY (EGD) WITH PROPOFOL N/A 05/31/2016   Dr. Gala Romney: LA Grade A  esophagitis, s/p dilation, erosive gastropathy, multiple benign gastric polyps, normal duodenum  . repair of rectal fissure    . SUPERFICIAL KERATECTOMY Bilateral    Social History   Social History Narrative  . Not on file   Immunization History  Administered Date(s) Administered  . Moderna SARS-COVID-2 Vaccination 04/23/2019, 05/22/2019     Objective: Vital Signs: BP 121/82 (BP Location: Left Arm, Patient Position: Sitting, Cuff Size: Normal)   Pulse 83   Resp 14   Ht 5\' 5"  (1.651 m)   Wt 119 lb 6.4 oz (54.2 kg)   BMI 19.87 kg/m    Physical Exam Vitals and nursing note reviewed.  Constitutional:      Appearance: She is well-developed.  HENT:     Head: Normocephalic and atraumatic.  Eyes:     Conjunctiva/sclera: Conjunctivae normal.  Pulmonary:     Effort: Pulmonary effort is normal.  Abdominal:     General: Bowel sounds are normal.     Palpations: Abdomen is soft.  Musculoskeletal:     Cervical back: Normal range of motion.  Lymphadenopathy:     Cervical: No cervical adenopathy.  Skin:    General: Skin is warm and dry.     Capillary Refill: Capillary refill takes less than 2 seconds.  Neurological:     Mental Status: She is alert and oriented to person, place, and time.  Psychiatric:        Behavior: Behavior normal.      Musculoskeletal Exam: C-spine limited range of motion with lateral rotation.  Thoracic and lumbar spine have good range of motion.  No midline spinal tenderness.  No SI joint tenderness.  Shoulder joints have good range of motion.  She has tenderness to palpation over bilateral AC joints.  Elbow joints have good range of motion no tenderness or synovitis.  Wrist joints, MCPs, PIPs, DIPs good range of motion with no synovitis.  She has PIP and DIP thickening consistent with osteoarthritis of both hands.  She has tenderness and synovitis of the right second PIP joint.  Hip joints have good range of motion with no discomfort.  Knee joints have good  range of motion with no warmth or effusion.  Ankle joints have good range of motion with no tenderness or synovitis.  No tenderness of MTP joints.  CDAI Exam: CDAI Score: -- Patient Global: --; Provider Global: -- Swollen: --; Tender: -- Joint Exam 06/18/2019   No joint exam has been documented for this visit   There is currently no information documented on the homunculus. Go to the Rheumatology activity and complete the homunculus joint exam.  Investigation: No additional findings.  Imaging: XR Shoulder Left  Result Date: 06/18/2019 No glenohumeral joint space narrowing was noted.  Acromioclavicular arthritis was noted.  No chondrocalcinosis was noted. Impression: These findings are consistent with acromioclavicular arthritis.  XR Shoulder Right  Result Date: 06/18/2019 No glenohumeral joint space narrowing was noted.  Spurring of acromioclavicular joint was noted.  No chondrocalcinosis was noted. Impression: These findings are consistent with acromioclavicular arthritis.   Recent Labs: Lab Results  Component Value Date   WBC 6.6 06/25/2016   HGB 14.0 06/25/2016   PLT 219 06/25/2016   NA 141 06/25/2016   K 4.2 06/25/2016   CL 99 06/25/2016   CO2 26 06/25/2016  GLUCOSE 96 06/25/2016   BUN 18 06/25/2016   CREATININE 1.00 06/25/2016   BILITOT 1.4 (H) 06/25/2016   ALKPHOS 66 06/25/2016   AST 8 (L) 01/07/2018   ALT 13 01/07/2018   PROT 6.6 06/25/2016   ALBUMIN 4.1 06/25/2016   CALCIUM 9.3 06/25/2016   GFRAA 65 06/25/2016    Speciality Comments: No specialty comments available.  Procedures:  No procedures performed Allergies: Cortisone   Assessment / Plan:     Visit Diagnoses: ANA positive - Positive ANA, positive double-stranded DNA, positive beta-2: She has no clinical features of autoimmune disease at this time.  She is not taking any immunosuppressive agents at this time.  She has not had any recent rashes, oral or nasal ulcerations, enlarged lymph nodes,  photosensitivity, hair loss, or symptoms of Raynaud's.  No malar rash noted.  No digital ulcerations or signs or gangrene noted. She experiences eye dryness and uses Systane eyedrops.  We will recheck autoimmune lab work today.  She was advised to notify us if she develops any new or worsening symptoms. She will follow up in the office in 6 months.- Plan: CBC with Differential/Platelet, COMPLETE METABOLIC PANEL WITH GFR, Urinalysis, Routine w reflex microscopic, ANA, Anti-DNA antibody, double-stranded, C3 and C4, Sedimentation rate, Beta-2 glycoprotein antibodies  Primary osteoarthritis of both hands: She has severe PIP and DIP thickening consistent with osteoarthritis of both hands.  She has tenderness and synovitis of the right second PIP joint.  She experiences discomfort and stiffness in both hands intermittently.  Joint protection and muscle strengthening were discussed.  Trochanteric bursitis of left hip: She has tenderness to palpation over the left trochanter bursa.  She was encouraged to perform stretching exercises daily.  Primary osteoarthritis of both knees: She has good range of motion of both knee joints on exam.  No warmth or effusion was noted.  DDD (degenerative disc disease), cervical - Status post discectomy: She has limited range of motion especially with lateral rotation.  She experiences pain and stiffness in her C-spine intermittently.  She is not having any symptoms of radiculopathy at this time.  DDD (degenerative disc disease), lumbar - W/ scoliosis and facet joint arthropathy: Chronic pain.  She has experienced increased discomfort in her lower back since being hospitalized in January after having a stroke.  According to the patient her lower back pain was exacerbated by sleeping in hospital bed.  She has no midline spinal tenderness on exam today.  She is not had any symptoms of radiculopathy.  She has been going to physical therapy 2 times a week.  Osteopenia of multiple  sites: She is taking calcium and vitamin D supplement.  Chronic pain syndrome: She takes tramadol as needed for pain relief.  Primary insomnia: She takes trazodone 50 mg 1 tablet by mouth at bedtime for insomnia.  History of gastroesophageal reflux (GERD)  Chronic pain of both shoulders -She presents today with pain in both shoulder joints.  She has full range of motion of both shoulders on exam.  She has tenderness over the acromioclavicular joint bilaterally.  X-rays of both shoulders were obtained today, which revealed acromioclavicular arthritis bilaterally.  She was given a handout of shoulder joint exercises to perform.  Plan: XR Shoulder Right, XR Shoulder Left  Orders: Orders Placed This Encounter  Procedures  . XR Shoulder Right  . XR Shoulder Left  . CBC with Differential/Platelet  . COMPLETE METABOLIC PANEL WITH GFR  . Urinalysis, Routine w reflex microscopic  . ANA  .  Anti-DNA antibody, double-stranded  . C3 and C4  . Sedimentation rate  . Beta-2 glycoprotein antibodies   No orders of the defined types were placed in this encounter.   Face-to-face time spent with patient was 30 minutes. Greater than 50% of time was spent in counseling and coordination of care.  Follow-Up Instructions: Return in about 6 months (around 12/18/2019) for Osteoarthritis, DDD.   Ofilia Neas, PA-C   I examined and evaluated the patient with Hazel Sams PA.  Patient has been experiencing some discomfort in her shoulders.  She had good range of motion with discomfort.  X-rays were unremarkable except for acromioclavicular arthritis.  She was given a handout on shoulder joint exercises.  We will check her autoimmune labs today.  The plan of care was discussed as noted above.  Bo Merino, MD  Note - This record has been created using Editor, commissioning.  Chart creation errors have been sought, but may not always  have been located. Such creation errors do not reflect on  the standard of  medical care.

## 2019-06-17 DIAGNOSIS — R278 Other lack of coordination: Secondary | ICD-10-CM | POA: Diagnosis not present

## 2019-06-17 DIAGNOSIS — R202 Paresthesia of skin: Secondary | ICD-10-CM | POA: Diagnosis not present

## 2019-06-17 DIAGNOSIS — I639 Cerebral infarction, unspecified: Secondary | ICD-10-CM | POA: Diagnosis not present

## 2019-06-17 DIAGNOSIS — M6281 Muscle weakness (generalized): Secondary | ICD-10-CM | POA: Diagnosis not present

## 2019-06-18 ENCOUNTER — Other Ambulatory Visit: Payer: Self-pay

## 2019-06-18 ENCOUNTER — Ambulatory Visit: Payer: Self-pay

## 2019-06-18 ENCOUNTER — Encounter: Payer: Self-pay | Admitting: Rheumatology

## 2019-06-18 ENCOUNTER — Ambulatory Visit (INDEPENDENT_AMBULATORY_CARE_PROVIDER_SITE_OTHER): Payer: Medicare Other | Admitting: Rheumatology

## 2019-06-18 VITALS — BP 121/82 | HR 83 | Resp 14 | Ht 65.0 in | Wt 119.4 lb

## 2019-06-18 DIAGNOSIS — M5030 Other cervical disc degeneration, unspecified cervical region: Secondary | ICD-10-CM | POA: Diagnosis not present

## 2019-06-18 DIAGNOSIS — G894 Chronic pain syndrome: Secondary | ICD-10-CM | POA: Diagnosis not present

## 2019-06-18 DIAGNOSIS — M19041 Primary osteoarthritis, right hand: Secondary | ICD-10-CM | POA: Diagnosis not present

## 2019-06-18 DIAGNOSIS — G8929 Other chronic pain: Secondary | ICD-10-CM | POA: Diagnosis not present

## 2019-06-18 DIAGNOSIS — F5101 Primary insomnia: Secondary | ICD-10-CM | POA: Diagnosis not present

## 2019-06-18 DIAGNOSIS — M8589 Other specified disorders of bone density and structure, multiple sites: Secondary | ICD-10-CM

## 2019-06-18 DIAGNOSIS — M25511 Pain in right shoulder: Secondary | ICD-10-CM

## 2019-06-18 DIAGNOSIS — Z8719 Personal history of other diseases of the digestive system: Secondary | ICD-10-CM | POA: Diagnosis not present

## 2019-06-18 DIAGNOSIS — R768 Other specified abnormal immunological findings in serum: Secondary | ICD-10-CM | POA: Diagnosis not present

## 2019-06-18 DIAGNOSIS — M7062 Trochanteric bursitis, left hip: Secondary | ICD-10-CM

## 2019-06-18 DIAGNOSIS — M25512 Pain in left shoulder: Secondary | ICD-10-CM

## 2019-06-18 DIAGNOSIS — M170 Bilateral primary osteoarthritis of knee: Secondary | ICD-10-CM | POA: Diagnosis not present

## 2019-06-18 DIAGNOSIS — M19042 Primary osteoarthritis, left hand: Secondary | ICD-10-CM

## 2019-06-18 DIAGNOSIS — M5136 Other intervertebral disc degeneration, lumbar region: Secondary | ICD-10-CM | POA: Diagnosis not present

## 2019-06-18 NOTE — Patient Instructions (Signed)
Shoulder Exercises Ask your health care provider which exercises are safe for you. Do exercises exactly as told by your health care provider and adjust them as directed. It is normal to feel mild stretching, pulling, tightness, or discomfort as you do these exercises. Stop right away if you feel sudden pain or your pain gets worse. Do not begin these exercises until told by your health care provider. Stretching exercises External rotation and abduction This exercise is sometimes called corner stretch. This exercise rotates your arm outward (external rotation) and moves your arm out from your body (abduction). 1. Stand in a doorway with one of your feet slightly in front of the other. This is called a staggered stance. If you cannot reach your forearms to the door frame, stand facing a corner of a room. 2. Choose one of the following positions as told by your health care provider: ? Place your hands and forearms on the door frame above your head. ? Place your hands and forearms on the door frame at the height of your head. ? Place your hands on the door frame at the height of your elbows. 3. Slowly move your weight onto your front foot until you feel a stretch across your chest and in the front of your shoulders. Keep your head and chest upright and keep your abdominal muscles tight. 4. Hold for __________ seconds. 5. To release the stretch, shift your weight to your back foot. Repeat __________ times. Complete this exercise __________ times a day. Extension, standing 1. Stand and hold a broomstick, a cane, or a similar object behind your back. ? Your hands should be a little wider than shoulder width apart. ? Your palms should face away from your back. 2. Keeping your elbows straight and your shoulder muscles relaxed, move the stick away from your body until you feel a stretch in your shoulders (extension). ? Avoid shrugging your shoulders while you move the stick. Keep your shoulder blades tucked  down toward the middle of your back. 3. Hold for __________ seconds. 4. Slowly return to the starting position. Repeat __________ times. Complete this exercise __________ times a day. Range-of-motion exercises Pendulum  1. Stand near a wall or a surface that you can hold onto for balance. 2. Bend at the waist and let your left / right arm hang straight down. Use your other arm to support you. Keep your back straight and do not lock your knees. 3. Relax your left / right arm and shoulder muscles, and move your hips and your trunk so your left / right arm swings freely. Your arm should swing because of the motion of your body, not because you are using your arm or shoulder muscles. 4. Keep moving your hips and trunk so your arm swings in the following directions, as told by your health care provider: ? Side to side. ? Forward and backward. ? In clockwise and counterclockwise circles. 5. Continue each motion for __________ seconds, or for as long as told by your health care provider. 6. Slowly return to the starting position. Repeat __________ times. Complete this exercise __________ times a day. Shoulder flexion, standing  1. Stand and hold a broomstick, a cane, or a similar object. Place your hands a little more than shoulder width apart on the object. Your left / right hand should be palm up, and your other hand should be palm down. 2. Keep your elbow straight and your shoulder muscles relaxed. Push the stick up with your healthy arm to   raise your left / right arm in front of your body, and then over your head until you feel a stretch in your shoulder (flexion). ? Avoid shrugging your shoulder while you raise your arm. Keep your shoulder blade tucked down toward the middle of your back. 3. Hold for __________ seconds. 4. Slowly return to the starting position. Repeat __________ times. Complete this exercise __________ times a day. Shoulder abduction, standing 1. Stand and hold a broomstick,  a cane, or a similar object. Place your hands a little more than shoulder width apart on the object. Your left / right hand should be palm up, and your other hand should be palm down. 2. Keep your elbow straight and your shoulder muscles relaxed. Push the object across your body toward your left / right side. Raise your left / right arm to the side of your body (abduction) until you feel a stretch in your shoulder. ? Do not raise your arm above shoulder height unless your health care provider tells you to do that. ? If directed, raise your arm over your head. ? Avoid shrugging your shoulder while you raise your arm. Keep your shoulder blade tucked down toward the middle of your back. 3. Hold for __________ seconds. 4. Slowly return to the starting position. Repeat __________ times. Complete this exercise __________ times a day. Internal rotation  1. Place your left / right hand behind your back, palm up. 2. Use your other hand to dangle an exercise band, a towel, or a similar object over your shoulder. Grasp the band with your left / right hand so you are holding on to both ends. 3. Gently pull up on the band until you feel a stretch in the front of your left / right shoulder. The movement of your arm toward the center of your body is called internal rotation. ? Avoid shrugging your shoulder while you raise your arm. Keep your shoulder blade tucked down toward the middle of your back. 4. Hold for __________ seconds. 5. Release the stretch by letting go of the band and lowering your hands. Repeat __________ times. Complete this exercise __________ times a day. Strengthening exercises External rotation  1. Sit in a stable chair without armrests. 2. Secure an exercise band to a stable object at elbow height on your left / right side. 3. Place a soft object, such as a folded towel or a small pillow, between your left / right upper arm and your body to move your elbow about 4 inches (10 cm) away  from your side. 4. Hold the end of the exercise band so it is tight and there is no slack. 5. Keeping your elbow pressed against the soft object, slowly move your forearm out, away from your abdomen (external rotation). Keep your body steady so only your forearm moves. 6. Hold for __________ seconds. 7. Slowly return to the starting position. Repeat __________ times. Complete this exercise __________ times a day. Shoulder abduction  1. Sit in a stable chair without armrests, or stand up. 2. Hold a __________ weight in your left / right hand, or hold an exercise band with both hands. 3. Start with your arms straight down and your left / right palm facing in, toward your body. 4. Slowly lift your left / right hand out to your side (abduction). Do not lift your hand above shoulder height unless your health care provider tells you that this is safe. ? Keep your arms straight. ? Avoid shrugging your shoulder while you   do this movement. Keep your shoulder blade tucked down toward the middle of your back. 5. Hold for __________ seconds. 6. Slowly lower your arm, and return to the starting position. Repeat __________ times. Complete this exercise __________ times a day. Shoulder extension 1. Sit in a stable chair without armrests, or stand up. 2. Secure an exercise band to a stable object in front of you so it is at shoulder height. 3. Hold one end of the exercise band in each hand. Your palms should face each other. 4. Straighten your elbows and lift your hands up to shoulder height. 5. Step back, away from the secured end of the exercise band, until the band is tight and there is no slack. 6. Squeeze your shoulder blades together as you pull your hands down to the sides of your thighs (extension). Stop when your hands are straight down by your sides. Do not let your hands go behind your body. 7. Hold for __________ seconds. 8. Slowly return to the starting position. Repeat __________ times.  Complete this exercise __________ times a day. Shoulder row 1. Sit in a stable chair without armrests, or stand up. 2. Secure an exercise band to a stable object in front of you so it is at waist height. 3. Hold one end of the exercise band in each hand. Position your palms so that your thumbs are facing the ceiling (neutral position). 4. Bend each of your elbows to a 90-degree angle (right angle) and keep your upper arms at your sides. 5. Step back until the band is tight and there is no slack. 6. Slowly pull your elbows back behind you. 7. Hold for __________ seconds. 8. Slowly return to the starting position. Repeat __________ times. Complete this exercise __________ times a day. Shoulder press-ups  1. Sit in a stable chair that has armrests. Sit upright, with your feet flat on the floor. 2. Put your hands on the armrests so your elbows are bent and your fingers are pointing forward. Your hands should be about even with the sides of your body. 3. Push down on the armrests and use your arms to lift yourself off the chair. Straighten your elbows and lift yourself up as much as you comfortably can. ? Move your shoulder blades down, and avoid letting your shoulders move up toward your ears. ? Keep your feet on the ground. As you get stronger, your feet should support less of your body weight as you lift yourself up. 4. Hold for __________ seconds. 5. Slowly lower yourself back into the chair. Repeat __________ times. Complete this exercise __________ times a day. Wall push-ups  1. Stand so you are facing a stable wall. Your feet should be about one arm-length away from the wall. 2. Lean forward and place your palms on the wall at shoulder height. 3. Keep your feet flat on the floor as you bend your elbows and lean forward toward the wall. 4. Hold for __________ seconds. 5. Straighten your elbows to push yourself back to the starting position. Repeat __________ times. Complete this exercise  __________ times a day. This information is not intended to replace advice given to you by your health care provider. Make sure you discuss any questions you have with your health care provider. Document Revised: 06/27/2018 Document Reviewed: 04/04/2018 [employer]vier Patient Education  2020 [employer]  

## 2019-06-19 DIAGNOSIS — M8580 Other specified disorders of bone density and structure, unspecified site: Secondary | ICD-10-CM | POA: Diagnosis not present

## 2019-06-19 DIAGNOSIS — M799 Soft tissue disorder, unspecified: Secondary | ICD-10-CM | POA: Diagnosis not present

## 2019-06-19 DIAGNOSIS — M1990 Unspecified osteoarthritis, unspecified site: Secondary | ICD-10-CM | POA: Diagnosis not present

## 2019-06-19 DIAGNOSIS — M6281 Muscle weakness (generalized): Secondary | ICD-10-CM | POA: Diagnosis not present

## 2019-06-19 DIAGNOSIS — R262 Difficulty in walking, not elsewhere classified: Secondary | ICD-10-CM | POA: Diagnosis not present

## 2019-06-19 DIAGNOSIS — I639 Cerebral infarction, unspecified: Secondary | ICD-10-CM | POA: Diagnosis not present

## 2019-06-19 DIAGNOSIS — R269 Unspecified abnormalities of gait and mobility: Secondary | ICD-10-CM | POA: Diagnosis not present

## 2019-06-19 DIAGNOSIS — R293 Abnormal posture: Secondary | ICD-10-CM | POA: Diagnosis not present

## 2019-06-22 DIAGNOSIS — I639 Cerebral infarction, unspecified: Secondary | ICD-10-CM | POA: Diagnosis not present

## 2019-06-22 DIAGNOSIS — M6281 Muscle weakness (generalized): Secondary | ICD-10-CM | POA: Diagnosis not present

## 2019-06-22 DIAGNOSIS — R262 Difficulty in walking, not elsewhere classified: Secondary | ICD-10-CM | POA: Diagnosis not present

## 2019-06-22 DIAGNOSIS — R269 Unspecified abnormalities of gait and mobility: Secondary | ICD-10-CM | POA: Diagnosis not present

## 2019-06-22 DIAGNOSIS — M8580 Other specified disorders of bone density and structure, unspecified site: Secondary | ICD-10-CM | POA: Diagnosis not present

## 2019-06-22 DIAGNOSIS — M799 Soft tissue disorder, unspecified: Secondary | ICD-10-CM | POA: Diagnosis not present

## 2019-06-22 DIAGNOSIS — M1990 Unspecified osteoarthritis, unspecified site: Secondary | ICD-10-CM | POA: Diagnosis not present

## 2019-06-22 DIAGNOSIS — R293 Abnormal posture: Secondary | ICD-10-CM | POA: Diagnosis not present

## 2019-06-22 LAB — URINALYSIS, ROUTINE W REFLEX MICROSCOPIC
Bilirubin Urine: NEGATIVE
Glucose, UA: NEGATIVE
Hgb urine dipstick: NEGATIVE
Ketones, ur: NEGATIVE
Leukocytes,Ua: NEGATIVE
Nitrite: NEGATIVE
Protein, ur: NEGATIVE
Specific Gravity, Urine: 1.024 (ref 1.001–1.03)
pH: <=5 (ref 5.0–8.0)

## 2019-06-22 LAB — CBC WITH DIFFERENTIAL/PLATELET
Absolute Monocytes: 407 cells/uL (ref 200–950)
Basophils Absolute: 20 cells/uL (ref 0–200)
Basophils Relative: 0.4 %
Eosinophils Absolute: 201 cells/uL (ref 15–500)
Eosinophils Relative: 4.1 %
HCT: 42.7 % (ref 35.0–45.0)
Hemoglobin: 14.2 g/dL (ref 11.7–15.5)
Lymphs Abs: 2019 cells/uL (ref 850–3900)
MCH: 28.6 pg (ref 27.0–33.0)
MCHC: 33.3 g/dL (ref 32.0–36.0)
MCV: 85.9 fL (ref 80.0–100.0)
MPV: 11.9 fL (ref 7.5–12.5)
Monocytes Relative: 8.3 %
Neutro Abs: 2254 cells/uL (ref 1500–7800)
Neutrophils Relative %: 46 %
Platelets: 205 10*3/uL (ref 140–400)
RBC: 4.97 10*6/uL (ref 3.80–5.10)
RDW: 12.5 % (ref 11.0–15.0)
Total Lymphocyte: 41.2 %
WBC: 4.9 10*3/uL (ref 3.8–10.8)

## 2019-06-22 LAB — COMPLETE METABOLIC PANEL WITH GFR
AG Ratio: 1.7 (calc) (ref 1.0–2.5)
ALT: 14 U/L (ref 6–29)
AST: 11 U/L (ref 10–35)
Albumin: 4.2 g/dL (ref 3.6–5.1)
Alkaline phosphatase (APISO): 59 U/L (ref 37–153)
BUN/Creatinine Ratio: 16 (calc) (ref 6–22)
BUN: 17 mg/dL (ref 7–25)
CO2: 30 mmol/L (ref 20–32)
Calcium: 9.7 mg/dL (ref 8.6–10.4)
Chloride: 103 mmol/L (ref 98–110)
Creat: 1.07 mg/dL — ABNORMAL HIGH (ref 0.60–0.93)
GFR, Est African American: 58 mL/min/{1.73_m2} — ABNORMAL LOW (ref >=60)
GFR, Est Non African American: 50 mL/min/{1.73_m2} — ABNORMAL LOW (ref >=60)
Globulin: 2.5 g/dL (calc) (ref 1.9–3.7)
Glucose, Bld: 89 mg/dL (ref 65–99)
Potassium: 4.3 mmol/L (ref 3.5–5.3)
Sodium: 141 mmol/L (ref 135–146)
Total Bilirubin: 1.5 mg/dL — ABNORMAL HIGH (ref 0.2–1.2)
Total Protein: 6.7 g/dL (ref 6.1–8.1)

## 2019-06-22 LAB — BETA-2 GLYCOPROTEIN ANTIBODIES
Beta-2 Glyco 1 IgA: <9 SAU (ref <=20)
Beta-2 Glyco 1 IgM: <9 SMU (ref <=20)
Beta-2 Glyco I IgG: 86 SGU — ABNORMAL HIGH (ref <=20)

## 2019-06-22 LAB — C3 AND C4
C3 Complement: 104 mg/dL (ref 83–193)
C4 Complement: 18 mg/dL (ref 15–57)

## 2019-06-22 LAB — ANA: Anti Nuclear Antibody (ANA): NEGATIVE

## 2019-06-22 LAB — ANTI-DNA ANTIBODY, DOUBLE-STRANDED: ds DNA Ab: 18 IU/mL — ABNORMAL HIGH

## 2019-06-22 LAB — SEDIMENTATION RATE: Sed Rate: 2 mm/h (ref 0–30)

## 2019-06-22 NOTE — Progress Notes (Signed)
CBC WNL.  Creatinine is mildly elevated and GFR is low-50.  ESR WNL.  DsDNA remains positive and is trending up.  ANA negative. Complements WNL.  Beta-2 IgG positive.  UA normal. Reviewed labs with Dr. Estanislado Pandy and she would like the patient to return to discuss labs and treatment options.

## 2019-06-23 ENCOUNTER — Ambulatory Visit: Payer: Medicare Other | Admitting: Physician Assistant

## 2019-06-23 NOTE — Progress Notes (Signed)
Office Visit Note  Patient: Sheila Oliver             Date of Birth: 04-04-1940           MRN: [id number]             PCP: Monico Blitz, MD Referring: Monico Blitz, MD Visit Date: 06/25/2019 Occupation: '@GUAROCC'$ @  Subjective:  Joint pain and discussed lab results.   History of Present Illness: Sheila Oliver is a 79 y.o. female with history of positive ANA, double-stranded DNA embedded to entire body.  She states she continues to have pain and discomfort in her bilateral shoulders.  She also has been experiencing increased fatigue.  The lower back pain persist.  She has been experiencing some nocturnal pain in her shoulders.  She came today to discuss lab results.  She was accompanied by her husband.  Activities of Daily Living:  Patient reports morning stiffness for 5 minute.   Patient Reports nocturnal pain.  Difficulty dressing/grooming: Denies Difficulty climbing stairs: Denies Difficulty getting out of chair: Denies Difficulty using hands for taps, buttons, cutlery, and/or writing: Reports  Review of Systems  Constitutional: Positive for fatigue. Negative for night sweats, weight gain and weight loss.  HENT: Negative for mouth sores, trouble swallowing, trouble swallowing, mouth dryness and nose dryness.   Eyes: Negative for pain, redness, visual disturbance and dryness.  Respiratory: Negative for cough, shortness of breath and difficulty breathing.   Cardiovascular: Negative for chest pain, palpitations, hypertension, irregular heartbeat and swelling in legs/feet.  Gastrointestinal: Negative for blood in stool, constipation and diarrhea.  Endocrine: Negative for increased urination.  Genitourinary: Negative for vaginal dryness.  Musculoskeletal: Positive for arthralgias, joint pain, myalgias, morning stiffness and myalgias. Negative for joint swelling, muscle weakness and muscle tenderness.  Skin: Negative for color change, rash, hair loss, skin tightness, ulcers and  sensitivity to sunlight.  Allergic/Immunologic: Negative for susceptible to infections.  Neurological: Negative for dizziness, memory loss, night sweats and weakness.  Hematological: Negative for swollen glands.  Psychiatric/Behavioral: Positive for sleep disturbance. Negative for depressed mood. The patient is not nervous/anxious.     PMFS History:  Patient Active Problem List   Diagnosis Date Noted  . External hemorrhoid 09/04/2017  . Chronic diarrhea 06/20/2016  . Osteopenia of multiple sites 05/15/2016  . Chronic pain syndrome 05/15/2016  . Primary insomnia 05/15/2016  . History of gastroesophageal reflux (GERD) 05/15/2016  . ANA positive 05/07/2016  . DJD (degenerative joint disease), cervical 05/07/2016  . Spondylosis of lumbar region without myelopathy or radiculopathy 05/07/2016  . Primary osteoarthritis of both hands 05/07/2016    Past Medical History:  Diagnosis Date  . C. difficile diarrhea   . DDD (degenerative disc disease), lumbar   . GERD (gastroesophageal reflux disease)   . Osteoporosis   . Scoliosis   . Stroke [facility]) 04/19/2019    History reviewed. No pertinent family history. Past Surgical History:  Procedure Laterality Date  . CATARACT EXTRACTION Left   . CERVICAL FUSION    . COLONOSCOPY WITH PROPOFOL N/A 05/31/2016   Dr. Gala Romney: sigmoid and descending colon diverticulosis, normal colonic biopsies  . ESOPHAGOGASTRODUODENOSCOPY (EGD) WITH PROPOFOL N/A 05/31/2016   Dr. Gala Romney: LA Grade A esophagitis, s/p dilation, erosive gastropathy, multiple benign gastric polyps, normal duodenum  . repair of rectal fissure    . SUPERFICIAL KERATECTOMY Bilateral    Social History   Social History Narrative  . Not on file   Immunization History  Administered Date(s) Administered  .  Moderna SARS-COVID-2 Vaccination 04/23/2019, 05/22/2019     Objective: Vital Signs: BP 125/80 (BP Location: Left Arm, Patient Position: Sitting, Cuff Size: Normal)   Pulse 81   Resp 16    Ht '5\' 5"'$  (1.651 m)   Wt 118 lb 9.6 oz (53.8 kg)   BMI 19.74 kg/m    Physical Exam Vitals and nursing note reviewed.  Constitutional:      Appearance: She is well-developed.  HENT:     Head: Normocephalic and atraumatic.  Eyes:     Conjunctiva/sclera: Conjunctivae normal.  Cardiovascular:     Rate and Rhythm: Normal rate and regular rhythm.     Heart sounds: Normal heart sounds.  Pulmonary:     Effort: Pulmonary effort is normal.     Breath sounds: Normal breath sounds.  Abdominal:     General: Bowel sounds are normal.     Palpations: Abdomen is soft.  Musculoskeletal:     Cervical back: Normal range of motion.  Lymphadenopathy:     Cervical: No cervical adenopathy.  Skin:    General: Skin is warm and dry.     Capillary Refill: Capillary refill takes less than 2 seconds.  Neurological:     Mental Status: She is alert and oriented to person, place, and time.  Psychiatric:        Behavior: Behavior normal.      Musculoskeletal Exam: Patient has limited range of motion of her cervical spine.  Shoulder joints elbow joints wrist joints MCPs PIPs DIPs with good range of motion with no synovitis.  Hip joints knee joints ankles MTPs PIPs with good range of motion with no synovitis.  CDAI Exam: CDAI Score: -- Patient Global: --; Provider Global: -- Swollen: --; Tender: -- Joint Exam 06/25/2019   No joint exam has been documented for this visit   There is currently no information documented on the homunculus. Go to the Rheumatology activity and complete the homunculus joint exam.  Investigation: No additional findings.  Imaging: XR Shoulder Left  Result Date: 06/18/2019 No glenohumeral joint space narrowing was noted.  Acromioclavicular arthritis was noted.  No chondrocalcinosis was noted. Impression: These findings are consistent with acromioclavicular arthritis.  XR Shoulder Right  Result Date: 06/18/2019 No glenohumeral joint space narrowing was noted.  Spurring of  acromioclavicular joint was noted.  No chondrocalcinosis was noted. Impression: These findings are consistent with acromioclavicular arthritis.   Recent Labs: Lab Results  Component Value Date   WBC 4.9 06/18/2019   HGB 14.2 06/18/2019   PLT 205 06/18/2019   NA 141 06/18/2019   K 4.3 06/18/2019   CL 103 06/18/2019   CO2 30 06/18/2019   GLUCOSE 89 06/18/2019   BUN 17 06/18/2019   CREATININE 1.07 (H) 06/18/2019   BILITOT 1.5 (H) 06/18/2019   ALKPHOS 66 06/25/2016   AST 11 06/18/2019   ALT 14 06/18/2019   PROT 6.7 06/18/2019   ALBUMIN 4.1 06/25/2016   CALCIUM 9.7 06/18/2019   GFRAA 58 (L) 06/18/2019   Beta-2 IgG 86, C3-C4 normal, ANA negative, dsDNA 18, ESR 2  Speciality Comments: No specialty comments available.  Procedures:  No procedures performed Allergies: Cortisone   Assessment / Plan:     Visit Diagnoses: ANA positive-in the past.  Her ANA is negative now.  Ds DNA antibody positive-she has significantly positive double-stranded DNA.  She also has beta-2 anticardiolipin IgG positive.  Complements are normal and sed rate is normal.  Patient is on Plavix.  She had a stroke in  January at the time aspirin was discontinued and Plavix was a started.  We had detailed discussion regarding the significance of double-stranded DNA and beta-2 anticardiolipin antibody with the patient and her husband.  I also discussed possible use of Plaquenil.  Indications side effects contraindications were discussed at length.  Patient declined the medication.  She is supposed to notify me if she develops any increased symptoms.  Elevated creatinine-her creatinine was elevated probably from not having enough fluids.  Per her request I will check BMP today.  Chronic pain in both shoulders-x-ray showed acromioclavicular arthritis.  The findings were also discussed.  She was given a handout on shoulder joint exercises which she will continue.  Primary osteoarthritis of both hands-she has chronic  discomfort in her hands.  She had no synovitis.  Trochanteric bursitis of left hip-she is a started doing some exercises.  DDD (degenerative disc disease), cervical-chronic pain  DDD (degenerative disc disease), lumbar-chronic pain  Osteopenia of multiple sites  Chronic pain syndrome  Primary insomnia  History of gastroesophageal reflux (GERD)  Primary osteoarthritis of both knees  History of stroke  Elevated serum creatinine - Plan: BASIC METABOLIC PANEL WITH GFR  Orders: Orders Placed This Encounter  Procedures  . BASIC METABOLIC PANEL WITH GFR   No orders of the defined types were placed in this encounter.     Follow-Up Instructions: Return in about 4 weeks (around 07/23/2019) for +dsDNA, +b2.   Bo Merino, MD  Note - This record has been created using Editor, commissioning.  Chart creation errors have been sought, but may not always  have been located. Such creation errors do not reflect on  the standard of medical care.

## 2019-06-25 ENCOUNTER — Other Ambulatory Visit: Payer: Self-pay

## 2019-06-25 ENCOUNTER — Ambulatory Visit (INDEPENDENT_AMBULATORY_CARE_PROVIDER_SITE_OTHER): Payer: Medicare Other | Admitting: Rheumatology

## 2019-06-25 ENCOUNTER — Encounter: Payer: Self-pay | Admitting: Rheumatology

## 2019-06-25 VITALS — BP 125/80 | HR 81 | Resp 16 | Ht 65.0 in | Wt 118.6 lb

## 2019-06-25 DIAGNOSIS — R7989 Other specified abnormal findings of blood chemistry: Secondary | ICD-10-CM

## 2019-06-25 DIAGNOSIS — M19041 Primary osteoarthritis, right hand: Secondary | ICD-10-CM | POA: Diagnosis not present

## 2019-06-25 DIAGNOSIS — M19042 Primary osteoarthritis, left hand: Secondary | ICD-10-CM

## 2019-06-25 DIAGNOSIS — M7062 Trochanteric bursitis, left hip: Secondary | ICD-10-CM

## 2019-06-25 DIAGNOSIS — M5136 Other intervertebral disc degeneration, lumbar region: Secondary | ICD-10-CM

## 2019-06-25 DIAGNOSIS — Z8719 Personal history of other diseases of the digestive system: Secondary | ICD-10-CM

## 2019-06-25 DIAGNOSIS — M25511 Pain in right shoulder: Secondary | ICD-10-CM

## 2019-06-25 DIAGNOSIS — F5101 Primary insomnia: Secondary | ICD-10-CM

## 2019-06-25 DIAGNOSIS — M170 Bilateral primary osteoarthritis of knee: Secondary | ICD-10-CM | POA: Diagnosis not present

## 2019-06-25 DIAGNOSIS — Z8673 Personal history of transient ischemic attack (TIA), and cerebral infarction without residual deficits: Secondary | ICD-10-CM

## 2019-06-25 DIAGNOSIS — M51369 Other intervertebral disc degeneration, lumbar region without mention of lumbar back pain or lower extremity pain: Secondary | ICD-10-CM

## 2019-06-25 DIAGNOSIS — G894 Chronic pain syndrome: Secondary | ICD-10-CM | POA: Diagnosis not present

## 2019-06-25 DIAGNOSIS — M5030 Other cervical disc degeneration, unspecified cervical region: Secondary | ICD-10-CM

## 2019-06-25 DIAGNOSIS — R768 Other specified abnormal immunological findings in serum: Secondary | ICD-10-CM | POA: Diagnosis not present

## 2019-06-25 DIAGNOSIS — G8929 Other chronic pain: Secondary | ICD-10-CM

## 2019-06-25 DIAGNOSIS — M25512 Pain in left shoulder: Secondary | ICD-10-CM

## 2019-06-25 DIAGNOSIS — M8589 Other specified disorders of bone density and structure, multiple sites: Secondary | ICD-10-CM

## 2019-06-25 LAB — BASIC METABOLIC PANEL WITH GFR
BUN/Creatinine Ratio: 16 (calc) (ref 6–22)
BUN: 17 mg/dL (ref 7–25)
CO2: 26 mmol/L (ref 20–32)
Calcium: 9.4 mg/dL (ref 8.6–10.4)
Chloride: 105 mmol/L (ref 98–110)
Creat: 1.05 mg/dL — ABNORMAL HIGH (ref 0.60–0.93)
GFR, Est African American: 60 mL/min/{1.73_m2} (ref >=60)
GFR, Est Non African American: 52 mL/min/{1.73_m2} — ABNORMAL LOW (ref >=60)
Glucose, Bld: 101 mg/dL — ABNORMAL HIGH (ref 65–99)
Potassium: 4.1 mmol/L (ref 3.5–5.3)
Sodium: 139 mmol/L (ref 135–146)

## 2019-06-30 DIAGNOSIS — I69359 Hemiplegia and hemiparesis following cerebral infarction affecting unspecified side: Secondary | ICD-10-CM | POA: Diagnosis not present

## 2019-06-30 DIAGNOSIS — I10 Essential (primary) hypertension: Secondary | ICD-10-CM | POA: Diagnosis not present

## 2019-06-30 DIAGNOSIS — Z299 Encounter for prophylactic measures, unspecified: Secondary | ICD-10-CM | POA: Diagnosis not present

## 2019-06-30 DIAGNOSIS — M1990 Unspecified osteoarthritis, unspecified site: Secondary | ICD-10-CM | POA: Diagnosis not present

## 2019-07-01 DIAGNOSIS — M8580 Other specified disorders of bone density and structure, unspecified site: Secondary | ICD-10-CM | POA: Diagnosis not present

## 2019-07-01 DIAGNOSIS — R262 Difficulty in walking, not elsewhere classified: Secondary | ICD-10-CM | POA: Diagnosis not present

## 2019-07-01 DIAGNOSIS — M1990 Unspecified osteoarthritis, unspecified site: Secondary | ICD-10-CM | POA: Diagnosis not present

## 2019-07-01 DIAGNOSIS — M6281 Muscle weakness (generalized): Secondary | ICD-10-CM | POA: Diagnosis not present

## 2019-07-01 DIAGNOSIS — M799 Soft tissue disorder, unspecified: Secondary | ICD-10-CM | POA: Diagnosis not present

## 2019-07-01 DIAGNOSIS — I639 Cerebral infarction, unspecified: Secondary | ICD-10-CM | POA: Diagnosis not present

## 2019-07-01 DIAGNOSIS — R293 Abnormal posture: Secondary | ICD-10-CM | POA: Diagnosis not present

## 2019-07-01 DIAGNOSIS — R269 Unspecified abnormalities of gait and mobility: Secondary | ICD-10-CM | POA: Diagnosis not present

## 2019-07-14 ENCOUNTER — Ambulatory Visit: Payer: Medicare Other | Admitting: Physician Assistant

## 2019-07-17 DIAGNOSIS — R1310 Dysphagia, unspecified: Secondary | ICD-10-CM | POA: Diagnosis not present

## 2019-07-17 DIAGNOSIS — I10 Essential (primary) hypertension: Secondary | ICD-10-CM | POA: Diagnosis not present

## 2019-07-28 ENCOUNTER — Other Ambulatory Visit: Payer: Self-pay | Admitting: Neurology

## 2019-07-28 ENCOUNTER — Encounter: Payer: Self-pay | Admitting: Neurology

## 2019-07-28 ENCOUNTER — Other Ambulatory Visit: Payer: Self-pay

## 2019-07-28 ENCOUNTER — Ambulatory Visit (INDEPENDENT_AMBULATORY_CARE_PROVIDER_SITE_OTHER): Payer: Medicare Other | Admitting: Neurology

## 2019-07-28 VITALS — BP 122/79 | HR 94 | Temp 97.3°F | Ht 65.0 in | Wt 116.0 lb

## 2019-07-28 DIAGNOSIS — G894 Chronic pain syndrome: Secondary | ICD-10-CM | POA: Diagnosis not present

## 2019-07-28 MED ORDER — CARBAMAZEPINE 200 MG PO TABS: 200.0000 mg | ORAL_TABLET | Freq: Three times a day (TID) | ORAL | 0 refills | Status: DC

## 2019-07-28 NOTE — Progress Notes (Signed)
SLEEP MEDICINE CLINIC    Provider:  Larey Seat, MD  Primary Care Physician:  Monico Blitz, [place] [address]     Referring Provider: Monico Blitz, [address] [place],  [place] [phone]          Chief Complaint according to patient   Patient presents with:    . New Patient (Initial Visit)       Dr. Manuella Ghazi referred for lack of energy, fatigue, all worse since a Januray stroke. Chronic pain interrupts her sleep.  She has pain management, and sees Dr. Estanislado Pandy for Lupus Ab. MRI images are not visible for me.       HISTORY OF PRESENT ILLNESS:  Sheila Oliver is a 79 y.o. year old  Caucasian female patient seen here as a referral on 07/28/2019 for evaluation of post CVA fatigue, poor sleep,  Chief concern according to patient :  "I also lost smell and taste with the stroke".    I have the pleasure of seeing Sheila Oliver today, a right -handed Caucasian female with a possible sleep disorder.  She has a past medical history of C. difficile diarrhea, DDD (degenerative disc disease), lumbar, GERD (gastroesophageal reflux disease), Osteoporosis, Scoliosis, and Stroke ([place]) (04/19/2019)..    Social history:  Patient is retired  and lives in a household with her spouse. The patient currently works/ used to work in shifts( night/ rotating,) Tobacco use; never   ETOH use none . Caffeine intake in form of Coffee( none ) Soda( 1-2) Tea ( none ) no energy drinks. Regular exercise- " can't do it"     Sheila Oliver is here upon referral of her primary internist from evening, she was admitted to [facility] on January 31 with symptoms of a stroke, the right thalamus was affected according to the MRI obtained at location, the patient remained with clumsiness, affecting the nondominant left hand, she reports also ongoing problems with taste and smell and the numbness around the face around the lips.  The numbness around the face certainly fits a thalamic distribution, right  hemispheric strokes often cause hypersomnia more fatigue and sleepiness.  The hemiparesis is mild at this time she can walk without a cane or a walker, and she is able to converse without hesitation.  She is very fatigued and she is followed for an autoimmune disease lupus.  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  She is actively enrolled in pain management rheumatology and has a internist, she is referred to the sleep clinic for an evaluation if a sleep disorder may contribute to her decreased level of energy, increased level of fatigue.  She has reported a history of insomnia also related to pain.  She endorsed the [place] at zero points- inability to fall asleep. She reports olfactory hallucinations or auras, smelling pineapple or onions.  She had both COVID shots.  2/4 and 3/5 th.     Sleep habits are as follows: The patient's dinner time is between 5.30 PM. The patient goes to bed at midnight  PM and averages sleep for 3-4  hours, wakes from heat and cold- she looks at the clock.   The preferred sleep position is right sided, with the support of 1pillow. Adjusted bed.  Dreams are reportedly rare.   8-9 AM is the usual rise time. The patient wakes up  spontaneously.  She reports not feeling refreshed or restored in AM, with symptoms such as pain and stiffness, numbness.    Review of Systems: Out of a complete 14 system review, the patient complains of only the following symptoms, and all other reviewed systems are negative.:  Fatigue, sleepiness , snoring, fragmented sleep, Insomnia with chronic pain, arthritis, recent CVA within 6 month.  Dysphagia, dysphonia.    How likely are you to doze in the following situations: 0 = not likely, 1 = slight chance, 2 = moderate chance, 3 = high chance   Sitting and Reading? Watching Television? Sitting inactive in a public place  (theater or meeting)? As a passenger in a car for an hour without a break? Lying down in the afternoon when circumstances permit? Sitting and talking to someone? Sitting quietly after lunch without alcohol? In a car, while stopped for a few minutes in traffic?   Total = 0/ 24 points .   Social History   Socioeconomic History  . Marital status: Widowed    Spouse name: Not on file  . Number of children: Not on file  . Years of education: Not on file  . Highest education level: Not on file  Occupational History  . Not on file  Tobacco Use  . Smoking status: Never Smoker  . Smokeless tobacco: Never Used  Substance and Sexual Activity  . Alcohol use: No  . Drug use: No  . Sexual activity: Yes    Birth control/protection: Post-menopausal  Other Topics Concern  . Not on file  Social History Narrative  . Not on file   Social Determinants of Health   Financial Resource Strain:   . Difficulty of Paying Living Expenses:   Food Insecurity:   . Worried About Charity fundraiser in the Last Year:   . Arboriculturist in the Last Year:   Transportation Needs:   . Film/video editor (Medical):   Marland Kitchen Lack of Transportation (Non-Medical):   Physical Activity:   . Days of Exercise per Week:   . Minutes of Exercise per Session:   Stress:   . Feeling of Stress :   Social Connections:   . Frequency of Communication with Friends and Family:   . Frequency of Social Gatherings with Friends and Family:   . Attends Religious Services:   . Active Member of Clubs or Organizations:   . Attends Archivist Meetings:   Marland Kitchen Marital Status:     No family history on file.  Past Medical History:  Diagnosis Date  . C. difficile diarrhea   . DDD (degenerative disc disease), lumbar   . GERD (gastroesophageal reflux disease)   . Osteoporosis   . Scoliosis   . Stroke [facility]) 04/19/2019    Past Surgical History:  Procedure Laterality Date  . CATARACT EXTRACTION Left   . CERVICAL  FUSION    . COLONOSCOPY WITH PROPOFOL N/A 05/31/2016   Dr. Gala Romney: sigmoid and descending colon diverticulosis, normal colonic biopsies  . ESOPHAGOGASTRODUODENOSCOPY (EGD) WITH PROPOFOL N/A 05/31/2016   Dr. Gala Romney: LA Grade A esophagitis, s/p dilation, erosive gastropathy, multiple benign gastric polyps, normal duodenum  . repair of rectal fissure    . SUPERFICIAL KERATECTOMY Bilateral      Current Outpatient Medications on File Prior to Visit  Medication Sig Dispense Refill  . amLODipine (NORVASC) 2.5 MG tablet Take by mouth daily.    Marland Kitchen aspirin EC 81 MG tablet 81 mg every other day.    Marland Kitchen  Calcium Carb-Cholecalciferol (CALCIUM 600+D) 600-800 MG-UNIT TABS Take 1 tablet by mouth 2 (two) times daily.    . clopidogrel (PLAVIX) 75 MG tablet Take 75 mg by mouth daily.    . diclofenac sodium (VOLTAREN) 1 % GEL Apply 2-4 grams to affected joint 4 times daily as needed. 400 g 2  . lactose free nutrition (BOOST) LIQD Take 237 mLs by mouth as needed.     Marland Kitchen LORazepam (ATIVAN) 0.5 MG tablet Take 0.5 mg by mouth at bedtime.     . Multiple Vitamin (MULTIVITAMIN WITH MINERALS) TABS tablet Take 1 tablet by mouth daily. Women's Centrum Silver 50+    . pantoprazole (PROTONIX) 40 MG tablet TAKE 1 TABLET BY MOUTH ONCE DAILY BEFORE BREAKFAST 30 tablet 5  . Polyethyl Glycol-Propyl Glycol (SYSTANE ULTRA OP) Apply 1-2 drops to eye 3 (three) times daily as needed (for dry eyes.).    Marland Kitchen Probiotic Product (ALIGN) 4 MG CAPS Take 1 capsule (4 mg total) by mouth daily. 30 capsule 11  . traMADol (ULTRAM) 50 MG tablet Take 50 mg by mouth every 6 (six) hours as needed (for pain).     . traZODone (DESYREL) 50 MG tablet Take 50 mg by mouth at bedtime.     No current facility-administered medications on file prior to visit.    Allergies  Allergen Reactions  . Cortisone     "caused early menopause"    Physical exam:  Today's Vitals   07/28/19 1356  BP: 122/79  Pulse: 94  Temp: (!) 97.3 F (36.3 C)  Weight: 116 lb  (52.6 kg)  Height: 5\' 5"  (1.651 m)   Body mass index is 19.3 kg/m.   Wt Readings from Last 3 Encounters:  07/28/19 116 lb (52.6 kg)  06/25/19 118 lb 9.6 oz (53.8 kg)  06/18/19 119 lb 6.4 oz (54.2 kg)     Ht Readings from Last 3 Encounters:  07/28/19 5\' 5"  (1.651 m)  06/25/19 5\' 5"  (1.651 m)  06/18/19 5\' 5"  (1.651 m)      General: The patient is awake, alert and appears not in acute distress. The patient is well groomed. Head: Normocephalic, atraumatic. Neck is supple. Mallampati 1  neck circumference: 12 inches . Nasal airflow patent.  Retrognathia is not  seen.  Dental status: n/a  Cardiovascular:  Regular rate and cardiac rhythm by pulse,  without distended neck veins. Respiratory: Lungs are clear to auscultation.  Skin:  Without evidence of ankle edema, or rash. Trunk: The patient's posture is affected by scoliosis.    Neurologic exam : The patient is awake and alert, oriented to place and time.   Memory subjective described as intact.  Attention span & concentration ability appears normal.  Speech is fluent,  without  dysarthria, dysphonia or aphasia.  Mood and affect are appropriate.   Cranial nerves: no loss of smell or taste reported  Pupils are equal and briskly reactive to light. Funduscopic exam deferred. Extraocular movements in vertical and horizontal planes were intact and without nystagmus. No Diplopia. Visual fields by finger perimetry are intact. Hearing was intact to soft voice and finger rubbing.    Facial sensation intact to fine touch.  Facial motor strength is symmetric and tongue and uvula move midline.  Neck ROM : rotation, tilt and flexion extension ROM were limited  for age  The shoulder shrug was not symmetrical. Frozen shoulder ?    Motor exam:  Symmetric bulk, tone and ROM.   Normal tone without cog-wheeling, loss of left  grip strength .   Sensory:  Fine touch, pinprick and vibration were reduced over the left arm and leg.  Proprioception  tested in the upper extremities was affected, pronator drift on the left.   Coordination: Rapid alternating movements in the fingers/hands were of normal speed.  The Finger-to-nose maneuver was without  evidence of ataxia, but there is left sided  dysmetria and mild  tremor.   Gait and station: Patient could rise unassisted from a seated position, walked without assistive device. She has scoliosis.   Stance is of normal width/ base. Toe and heel walk were deferred.  Deep tendon reflexes: in the  upper and lower extremities are symmetric and intact.  Babinski response was deferred.    there is an abundance of records to be reviewed which I summarized at the beginning of this visit. The patient is here to be evaluated for SLEEP,  not for stroke aftercare !    After spending a total time of  50  minutes face to face and additional time for physical and neurologic examination, review of laboratory studies,  personal review of imaging studies, reports and results of other testing and review of referral information / records as far as provided in visit, I have established the following assessments:  1)  Loss of proprioception,  grip weakness and numbness on the left extremities.    All fits a thalamic contralateral stroke.   2) Sleep architecture may well be affected as thalami are generating sleep spindles and sleep architecture. But insomnia is related to PAIN - chronic PAIN- and is handled by pain management.    My Plan is to proceed with:  1) attended sleep study declined by patient," I have been like this long before the stroke "  2) I do not treat chronic pain and can't assist in treatment for this lupus and arthritis patient. 3) thalamic distribution of oral, and perioral numbness.  4) smell and taste are slowly returning.    I would like to thank Monico Blitz, [address] [place],  [place] [address] for allowing me to meet  this pleasant patient.  She would be better treated by a Stroke  specialist with [facility].   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     CC: I will share my notes with rheumatologist.   Electronically signed by: Larey Seat, MD 07/28/2019 2:28 PM  [facility] Board certified by The [employer] of Sleep Medicine and Diplomate of the [employer] of Sleep Medicine. Board certified In Neurology through the [place], Fellow of the [employer] of Neurology. Medical Director of [facility].

## 2019-09-01 DIAGNOSIS — Z299 Encounter for prophylactic measures, unspecified: Secondary | ICD-10-CM | POA: Diagnosis not present

## 2019-09-01 DIAGNOSIS — M329 Systemic lupus erythematosus, unspecified: Secondary | ICD-10-CM | POA: Diagnosis not present

## 2019-09-01 DIAGNOSIS — M1990 Unspecified osteoarthritis, unspecified site: Secondary | ICD-10-CM | POA: Diagnosis not present

## 2019-09-01 DIAGNOSIS — E538 Deficiency of other specified B group vitamins: Secondary | ICD-10-CM | POA: Diagnosis not present

## 2019-09-01 DIAGNOSIS — I69359 Hemiplegia and hemiparesis following cerebral infarction affecting unspecified side: Secondary | ICD-10-CM | POA: Diagnosis not present

## 2019-09-01 DIAGNOSIS — I10 Essential (primary) hypertension: Secondary | ICD-10-CM | POA: Diagnosis not present

## 2019-09-15 ENCOUNTER — Other Ambulatory Visit (HOSPITAL_COMMUNITY): Payer: Self-pay | Admitting: Internal Medicine

## 2019-09-15 DIAGNOSIS — Z1231 Encounter for screening mammogram for malignant neoplasm of breast: Secondary | ICD-10-CM

## 2019-10-02 DIAGNOSIS — N183 Chronic kidney disease, stage 3 unspecified: Secondary | ICD-10-CM | POA: Diagnosis not present

## 2019-10-02 DIAGNOSIS — D692 Other nonthrombocytopenic purpura: Secondary | ICD-10-CM | POA: Diagnosis not present

## 2019-10-02 DIAGNOSIS — Z299 Encounter for prophylactic measures, unspecified: Secondary | ICD-10-CM | POA: Diagnosis not present

## 2019-10-02 DIAGNOSIS — E538 Deficiency of other specified B group vitamins: Secondary | ICD-10-CM | POA: Diagnosis not present

## 2019-10-07 ENCOUNTER — Ambulatory Visit (HOSPITAL_COMMUNITY)
Admission: RE | Admit: 2019-10-07 | Discharge: 2019-10-07 | Disposition: A | Discharge status: Home / Self Care | Payer: Medicare Other | Source: Ambulatory Visit | Attending: Internal Medicine | Admitting: Internal Medicine

## 2019-10-07 ENCOUNTER — Other Ambulatory Visit: Payer: Self-pay

## 2019-10-07 DIAGNOSIS — Z1231 Encounter for screening mammogram for malignant neoplasm of breast: Secondary | ICD-10-CM | POA: Insufficient documentation

## 2019-10-16 DIAGNOSIS — I129 Hypertensive chronic kidney disease with stage 1 through stage 4 chronic kidney disease, or unspecified chronic kidney disease: Secondary | ICD-10-CM | POA: Diagnosis not present

## 2019-10-16 DIAGNOSIS — E785 Hyperlipidemia, unspecified: Secondary | ICD-10-CM | POA: Diagnosis not present

## 2019-10-16 DIAGNOSIS — K219 Gastro-esophageal reflux disease without esophagitis: Secondary | ICD-10-CM | POA: Diagnosis not present

## 2019-10-16 DIAGNOSIS — N183 Chronic kidney disease, stage 3 unspecified: Secondary | ICD-10-CM | POA: Diagnosis not present

## 2019-11-03 DIAGNOSIS — L570 Actinic keratosis: Secondary | ICD-10-CM | POA: Diagnosis not present

## 2019-11-05 DIAGNOSIS — H353132 Nonexudative age-related macular degeneration, bilateral, intermediate dry stage: Secondary | ICD-10-CM | POA: Diagnosis not present

## 2019-11-17 DIAGNOSIS — K219 Gastro-esophageal reflux disease without esophagitis: Secondary | ICD-10-CM | POA: Diagnosis not present

## 2019-11-17 DIAGNOSIS — E7849 Other hyperlipidemia: Secondary | ICD-10-CM | POA: Diagnosis not present

## 2019-11-17 DIAGNOSIS — I129 Hypertensive chronic kidney disease with stage 1 through stage 4 chronic kidney disease, or unspecified chronic kidney disease: Secondary | ICD-10-CM | POA: Diagnosis not present

## 2019-11-17 DIAGNOSIS — N183 Chronic kidney disease, stage 3 unspecified: Secondary | ICD-10-CM | POA: Diagnosis not present

## 2019-11-27 DIAGNOSIS — Z299 Encounter for prophylactic measures, unspecified: Secondary | ICD-10-CM | POA: Diagnosis not present

## 2019-11-27 DIAGNOSIS — M329 Systemic lupus erythematosus, unspecified: Secondary | ICD-10-CM | POA: Diagnosis not present

## 2019-11-27 DIAGNOSIS — M1990 Unspecified osteoarthritis, unspecified site: Secondary | ICD-10-CM | POA: Diagnosis not present

## 2019-11-27 DIAGNOSIS — I69359 Hemiplegia and hemiparesis following cerebral infarction affecting unspecified side: Secondary | ICD-10-CM | POA: Diagnosis not present

## 2019-11-27 DIAGNOSIS — Z6820 Body mass index (BMI) 20.0-20.9, adult: Secondary | ICD-10-CM | POA: Diagnosis not present

## 2019-11-27 DIAGNOSIS — I10 Essential (primary) hypertension: Secondary | ICD-10-CM | POA: Diagnosis not present

## 2019-12-07 NOTE — Progress Notes (Signed)
Office Visit Note  Patient: Sheila Oliver             Date of Birth: [birth year]           MRN: [id number]             PCP: Monico Blitz, MD Referring: Monico Blitz, MD Visit Date: 12/17/2019 Occupation: @GUAROCC @  Subjective:  Lower back pain , left knee pain.   History of Present Illness: Sheila Oliver is a 79 y.o. female with history of osteoarthritis, degenerative disc disease and positive double-stranded DNA.  She states that she has had left-sided hemiparesis since her stroke in January 2021.  She states on August 29 she fell in her yard and injured her left knee.  She has been on blood thinners.  She developed swelling in her left knee joint.  She was seen by her PCP who aspirated her left knee joint this week and the aspirate was mostly blood.  Swelling did go slow down some.  She still have left knee joint swelling and ongoing discomfort.  She is also scheduled to have an injection to her lumbar spine.  She states she is off Plavix for that injection.  Activities of Daily Living:  Patient reports morning stiffness for 24 hours.   Patient Denies nocturnal pain.  Difficulty dressing/grooming: Denies Difficulty climbing stairs: Reports Difficulty getting out of chair: Denies Difficulty using hands for taps, buttons, cutlery, and/or writing: Reports  Review of Systems  Constitutional: Positive for fatigue. Negative for night sweats, weight gain and weight loss.  HENT: Positive for mouth dryness. Negative for mouth sores, trouble swallowing, trouble swallowing and nose dryness.   Eyes: Positive for dryness. Negative for pain, redness and visual disturbance.  Respiratory: Negative for cough, shortness of breath and difficulty breathing.   Cardiovascular: Negative for chest pain, palpitations, hypertension, irregular heartbeat and swelling in legs/feet.  Gastrointestinal: Negative for blood in stool, constipation and diarrhea.  Endocrine: Positive for cold intolerance and  increased urination.  Genitourinary: Negative for difficulty urinating and vaginal dryness.  Musculoskeletal: Positive for arthralgias, gait problem, joint pain, joint swelling, muscle weakness and morning stiffness. Negative for myalgias, muscle tenderness and myalgias.  Skin: Negative for color change, rash, hair loss, skin tightness, ulcers and sensitivity to sunlight.  Allergic/Immunologic: Negative for susceptible to infections.  Neurological: Positive for numbness and weakness. Negative for dizziness, memory loss and night sweats.  Hematological: Positive for bruising/bleeding tendency. Negative for swollen glands.  Psychiatric/Behavioral: Negative for depressed mood and sleep disturbance. The patient is not nervous/anxious.     PMFS History:  Patient Active Problem List   Diagnosis Date Noted  . External hemorrhoid 09/04/2017  . Chronic diarrhea 06/20/2016  . Osteopenia of multiple sites 05/15/2016  . Chronic pain syndrome 05/15/2016  . Primary insomnia 05/15/2016  . History of gastroesophageal reflux (GERD) 05/15/2016  . ANA positive 05/07/2016  . DJD (degenerative joint disease), cervical 05/07/2016  . Spondylosis of lumbar region without myelopathy or radiculopathy 05/07/2016  . Primary osteoarthritis of both hands 05/07/2016    Past Medical History:  Diagnosis Date  . C. difficile diarrhea   . DDD (degenerative disc disease), lumbar   . GERD (gastroesophageal reflux disease)   . Macular degeneration   . Osteoporosis   . Scoliosis   . Stroke [facility]) 04/19/2019    History reviewed. No pertinent family history. Past Surgical History:  Procedure Laterality Date  . CATARACT EXTRACTION Left   . CERVICAL FUSION    . COLONOSCOPY  WITH PROPOFOL N/A 05/31/2016   Dr. Gala Romney: sigmoid and descending colon diverticulosis, normal colonic biopsies  . ESOPHAGOGASTRODUODENOSCOPY (EGD) WITH PROPOFOL N/A 05/31/2016   Dr. Gala Romney: LA Grade A esophagitis, s/p dilation, erosive gastropathy,  multiple benign gastric polyps, normal duodenum  . repair of rectal fissure    . SUPERFICIAL KERATECTOMY Bilateral    Social History   Social History Narrative  . Not on file   Immunization History  Administered Date(s) Administered  . Moderna SARS-COVID-2 Vaccination 04/23/2019, 05/22/2019     Objective: Vital Signs: BP 117/76 (BP Location: Left Arm, Patient Position: Sitting, Cuff Size: Small)   Pulse 89   Resp 14   Ht 5\' 5"  (1.651 m)   Wt 121 lb 12.8 oz (55.2 kg)   BMI 20.27 kg/m    Physical Exam Vitals and nursing note reviewed.  Constitutional:      Appearance: She is well-developed.  HENT:     Head: Normocephalic and atraumatic.  Eyes:     Conjunctiva/sclera: Conjunctivae normal.  Cardiovascular:     Rate and Rhythm: Normal rate and regular rhythm.     Heart sounds: Normal heart sounds.  Pulmonary:     Effort: Pulmonary effort is normal.     Breath sounds: Normal breath sounds.  Abdominal:     General: Bowel sounds are normal.     Palpations: Abdomen is soft.  Musculoskeletal:     Cervical back: Normal range of motion.  Lymphadenopathy:     Cervical: No cervical adenopathy.  Skin:    General: Skin is warm and dry.     Capillary Refill: Capillary refill takes less than 2 seconds.  Neurological:     Mental Status: She is alert and oriented to person, place, and time.  Psychiatric:        Behavior: Behavior normal.      Musculoskeletal Exam: C-spine was in good range of motion, shoulder joints, elbow joints, wrist joints with good range of motion.  She has bilateral CMC PIP and DIP thickening.  Hip joints with good range of motion.  She has large effusion in her left knee joint with warmth.  Ankle joints and MTPs and PIPs with good range of motion with no tenderness.  CDAI Exam: CDAI Score: -- Patient Global: --; Provider Global: -- Swollen: --; Tender: -- Joint Exam 12/17/2019   No joint exam has been documented for this visit   There is currently  no information documented on the homunculus. Go to the Rheumatology activity and complete the homunculus joint exam.  Investigation: No additional findings.  Imaging: No results found.  Recent Labs: Lab Results  Component Value Date   WBC 4.9 06/18/2019   HGB 14.2 06/18/2019   PLT 205 06/18/2019   NA 139 06/25/2019   K 4.1 06/25/2019   CL 105 06/25/2019   CO2 26 06/25/2019   GLUCOSE 101 (H) 06/25/2019   BUN 17 06/25/2019   CREATININE 1.05 (H) 06/25/2019   BILITOT 1.5 (H) 06/18/2019   ALKPHOS 66 06/25/2016   AST 11 06/18/2019   ALT 14 06/18/2019   PROT 6.7 06/18/2019   ALBUMIN 4.1 06/25/2016   CALCIUM 9.4 06/25/2019   GFRAA 60 06/25/2019    Speciality Comments: No specialty comments available.  Procedures:  No procedures performed Allergies: Cortisone   Assessment / Plan:     Visit Diagnoses: ANA positive - in the past.  Her ANA is negative now.  Ds DNA antibody positive - significantly positive double-stranded DNA.  She also has  beta-2 anticardiolipin IgG positive.  Complements are normal and sed rate is normal.  I would repeat labs today.  She has had no increased symptoms.  She denies any history of oral ulcers, nasal ulcers, malar rash, photosensitivity, sicca symptoms are Raynaud's phenomenon.  Primary osteoarthritis of both hands-joint protection muscle strengthening was discussed.  Swelling of left knee joint-she had recent fall and trauma to her left knee joint.  She had left knee joint aspiration by her PCP.  She states mostly blood came out.  She still have some ongoing pain and swelling in the knee joint.  Primary osteoarthritis of both knees-she is history of underlying osteoarthritis.  Trochanteric bursitis of left hip-she has intermittent discomfort in her left trochanteric bursa for which stretching exercises were discussed.  DDD (degenerative disc disease), cervical-she had good range of motion in her cervical spine today.  DDD (degenerative disc  disease), lumbar-she has ongoing pain in her lower back.  She is a scheduled to get an injection and is off anticoagulation.  Core strengthening exercises were discussed.  Chronic pain of both shoulders - x-ray showed acromioclavicular arthritis.  She had good range of motion in her shoulder joints today.  Osteopenia of multiple sites-I do not have results of any of her bone densities.  She has had DEXA with her PCP.  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  Use of mask, social distancing and hand hygiene was discussed.  Use of monoclonal antibodies were also discussed in case she develops COVID-19 infection.  Orders: No orders of the defined types were placed in this encounter.  No orders of the defined types were placed in this encounter.     Follow-Up Instructions: Return in about 6 months (around 06/15/2020) for OA, DDD, +ds DNA.   Bo Merino, MD  Note - This record has been created using Editor, commissioning.  Chart creation errors have been sought, but may not always  have been located. Such creation errors do not reflect on  the standard of medical care.

## 2019-12-08 DIAGNOSIS — M5136 Other intervertebral disc degeneration, lumbar region: Secondary | ICD-10-CM | POA: Diagnosis not present

## 2019-12-08 DIAGNOSIS — M4807 Spinal stenosis, lumbosacral region: Secondary | ICD-10-CM | POA: Diagnosis not present

## 2019-12-08 DIAGNOSIS — M48062 Spinal stenosis, lumbar region with neurogenic claudication: Secondary | ICD-10-CM | POA: Diagnosis not present

## 2019-12-08 DIAGNOSIS — M47816 Spondylosis without myelopathy or radiculopathy, lumbar region: Secondary | ICD-10-CM | POA: Diagnosis not present

## 2019-12-11 DIAGNOSIS — I10 Essential (primary) hypertension: Secondary | ICD-10-CM | POA: Diagnosis not present

## 2019-12-11 DIAGNOSIS — Z299 Encounter for prophylactic measures, unspecified: Secondary | ICD-10-CM | POA: Diagnosis not present

## 2019-12-11 DIAGNOSIS — Z6820 Body mass index (BMI) 20.0-20.9, adult: Secondary | ICD-10-CM | POA: Diagnosis not present

## 2019-12-11 DIAGNOSIS — Z713 Dietary counseling and surveillance: Secondary | ICD-10-CM | POA: Diagnosis not present

## 2019-12-11 DIAGNOSIS — M7042 Prepatellar bursitis, left knee: Secondary | ICD-10-CM | POA: Diagnosis not present

## 2019-12-14 DIAGNOSIS — Z7189 Other specified counseling: Secondary | ICD-10-CM | POA: Diagnosis not present

## 2019-12-14 DIAGNOSIS — Z79899 Other long term (current) drug therapy: Secondary | ICD-10-CM | POA: Diagnosis not present

## 2019-12-14 DIAGNOSIS — Z299 Encounter for prophylactic measures, unspecified: Secondary | ICD-10-CM | POA: Diagnosis not present

## 2019-12-14 DIAGNOSIS — E7800 Pure hypercholesterolemia, unspecified: Secondary | ICD-10-CM | POA: Diagnosis not present

## 2019-12-14 DIAGNOSIS — Z1331 Encounter for screening for depression: Secondary | ICD-10-CM | POA: Diagnosis not present

## 2019-12-14 DIAGNOSIS — Z1339 Encounter for screening examination for other mental health and behavioral disorders: Secondary | ICD-10-CM | POA: Diagnosis not present

## 2019-12-14 DIAGNOSIS — I10 Essential (primary) hypertension: Secondary | ICD-10-CM | POA: Diagnosis not present

## 2019-12-14 DIAGNOSIS — Z0000 Encounter for general adult medical examination without abnormal findings: Secondary | ICD-10-CM | POA: Diagnosis not present

## 2019-12-14 DIAGNOSIS — E559 Vitamin D deficiency, unspecified: Secondary | ICD-10-CM | POA: Diagnosis not present

## 2019-12-14 DIAGNOSIS — Z6820 Body mass index (BMI) 20.0-20.9, adult: Secondary | ICD-10-CM | POA: Diagnosis not present

## 2019-12-14 DIAGNOSIS — R5383 Other fatigue: Secondary | ICD-10-CM | POA: Diagnosis not present

## 2019-12-17 ENCOUNTER — Encounter: Payer: Self-pay | Admitting: Rheumatology

## 2019-12-17 ENCOUNTER — Other Ambulatory Visit: Payer: Self-pay

## 2019-12-17 ENCOUNTER — Ambulatory Visit (INDEPENDENT_AMBULATORY_CARE_PROVIDER_SITE_OTHER): Payer: Medicare Other | Admitting: Rheumatology

## 2019-12-17 VITALS — BP 117/76 | HR 89 | Resp 14 | Ht 65.0 in | Wt 121.8 lb

## 2019-12-17 DIAGNOSIS — Z8719 Personal history of other diseases of the digestive system: Secondary | ICD-10-CM | POA: Diagnosis not present

## 2019-12-17 DIAGNOSIS — G894 Chronic pain syndrome: Secondary | ICD-10-CM

## 2019-12-17 DIAGNOSIS — M8589 Other specified disorders of bone density and structure, multiple sites: Secondary | ICD-10-CM

## 2019-12-17 DIAGNOSIS — M5136 Other intervertebral disc degeneration, lumbar region: Secondary | ICD-10-CM | POA: Diagnosis not present

## 2019-12-17 DIAGNOSIS — M7062 Trochanteric bursitis, left hip: Secondary | ICD-10-CM

## 2019-12-17 DIAGNOSIS — R7989 Other specified abnormal findings of blood chemistry: Secondary | ICD-10-CM

## 2019-12-17 DIAGNOSIS — M5030 Other cervical disc degeneration, unspecified cervical region: Secondary | ICD-10-CM | POA: Diagnosis not present

## 2019-12-17 DIAGNOSIS — M170 Bilateral primary osteoarthritis of knee: Secondary | ICD-10-CM | POA: Diagnosis not present

## 2019-12-17 DIAGNOSIS — M25462 Effusion, left knee: Secondary | ICD-10-CM | POA: Diagnosis not present

## 2019-12-17 DIAGNOSIS — M25511 Pain in right shoulder: Secondary | ICD-10-CM

## 2019-12-17 DIAGNOSIS — Z7189 Other specified counseling: Secondary | ICD-10-CM

## 2019-12-17 DIAGNOSIS — M19041 Primary osteoarthritis, right hand: Secondary | ICD-10-CM | POA: Diagnosis not present

## 2019-12-17 DIAGNOSIS — G8929 Other chronic pain: Secondary | ICD-10-CM

## 2019-12-17 DIAGNOSIS — F5101 Primary insomnia: Secondary | ICD-10-CM | POA: Diagnosis not present

## 2019-12-17 DIAGNOSIS — Z8673 Personal history of transient ischemic attack (TIA), and cerebral infarction without residual deficits: Secondary | ICD-10-CM

## 2019-12-17 DIAGNOSIS — R768 Other specified abnormal immunological findings in serum: Secondary | ICD-10-CM | POA: Diagnosis not present

## 2019-12-17 DIAGNOSIS — M19042 Primary osteoarthritis, left hand: Secondary | ICD-10-CM

## 2019-12-17 DIAGNOSIS — M25512 Pain in left shoulder: Secondary | ICD-10-CM

## 2019-12-18 LAB — BETA-2 GLYCOPROTEIN ANTIBODIES
Beta-2 Glyco 1 IgA: 13.5 U/mL
Beta-2 Glyco 1 IgM: <2 U/mL
Beta-2 Glyco I IgG: 14.2 U/mL

## 2019-12-18 LAB — C3 AND C4
C3 Complement: 110 mg/dL (ref 83–193)
C4 Complement: 21 mg/dL (ref 15–57)

## 2019-12-18 LAB — ANTI-DNA ANTIBODY, DOUBLE-STRANDED: ds DNA Ab: 18 IU/mL — ABNORMAL HIGH

## 2019-12-18 LAB — SEDIMENTATION RATE: Sed Rate: 2 mm/h (ref 0–30)

## 2019-12-18 NOTE — Progress Notes (Signed)
Double-stranded DNA is a stable, complements normal, beta-2 GP 1 is negative.  Patient was asymptomatic.  No need to add treatment.

## 2019-12-22 DIAGNOSIS — I6349 Cerebral infarction due to embolism of other cerebral artery: Secondary | ICD-10-CM | POA: Diagnosis not present

## 2019-12-22 DIAGNOSIS — I639 Cerebral infarction, unspecified: Secondary | ICD-10-CM | POA: Diagnosis not present

## 2019-12-22 DIAGNOSIS — I6930 Unspecified sequelae of cerebral infarction: Secondary | ICD-10-CM | POA: Diagnosis not present

## 2019-12-23 DIAGNOSIS — M5136 Other intervertebral disc degeneration, lumbar region: Secondary | ICD-10-CM | POA: Diagnosis not present

## 2019-12-23 DIAGNOSIS — M4726 Other spondylosis with radiculopathy, lumbar region: Secondary | ICD-10-CM | POA: Diagnosis not present

## 2019-12-30 DIAGNOSIS — I639 Cerebral infarction, unspecified: Secondary | ICD-10-CM | POA: Diagnosis not present

## 2020-01-21 DIAGNOSIS — M5136 Other intervertebral disc degeneration, lumbar region: Secondary | ICD-10-CM | POA: Diagnosis not present

## 2020-01-21 DIAGNOSIS — M4807 Spinal stenosis, lumbosacral region: Secondary | ICD-10-CM | POA: Diagnosis not present

## 2020-01-21 DIAGNOSIS — M7062 Trochanteric bursitis, left hip: Secondary | ICD-10-CM | POA: Diagnosis not present

## 2020-01-21 DIAGNOSIS — M47816 Spondylosis without myelopathy or radiculopathy, lumbar region: Secondary | ICD-10-CM | POA: Diagnosis not present

## 2020-01-21 DIAGNOSIS — M5416 Radiculopathy, lumbar region: Secondary | ICD-10-CM | POA: Diagnosis not present

## 2020-01-26 DIAGNOSIS — I6930 Unspecified sequelae of cerebral infarction: Secondary | ICD-10-CM | POA: Diagnosis not present

## 2020-01-26 DIAGNOSIS — I639 Cerebral infarction, unspecified: Secondary | ICD-10-CM | POA: Diagnosis not present

## 2020-01-26 DIAGNOSIS — I6349 Cerebral infarction due to embolism of other cerebral artery: Secondary | ICD-10-CM | POA: Diagnosis not present

## 2020-02-01 DIAGNOSIS — Z23 Encounter for immunization: Secondary | ICD-10-CM | POA: Diagnosis not present

## 2020-02-16 DIAGNOSIS — N183 Chronic kidney disease, stage 3 unspecified: Secondary | ICD-10-CM | POA: Diagnosis not present

## 2020-02-16 DIAGNOSIS — E7849 Other hyperlipidemia: Secondary | ICD-10-CM | POA: Diagnosis not present

## 2020-02-16 DIAGNOSIS — I129 Hypertensive chronic kidney disease with stage 1 through stage 4 chronic kidney disease, or unspecified chronic kidney disease: Secondary | ICD-10-CM | POA: Diagnosis not present

## 2020-02-16 DIAGNOSIS — K219 Gastro-esophageal reflux disease without esophagitis: Secondary | ICD-10-CM | POA: Diagnosis not present

## 2020-03-14 DIAGNOSIS — N183 Chronic kidney disease, stage 3 unspecified: Secondary | ICD-10-CM | POA: Diagnosis not present

## 2020-03-14 DIAGNOSIS — Z299 Encounter for prophylactic measures, unspecified: Secondary | ICD-10-CM | POA: Diagnosis not present

## 2020-03-14 DIAGNOSIS — M1990 Unspecified osteoarthritis, unspecified site: Secondary | ICD-10-CM | POA: Diagnosis not present

## 2020-03-14 DIAGNOSIS — I10 Essential (primary) hypertension: Secondary | ICD-10-CM | POA: Diagnosis not present

## 2020-03-14 DIAGNOSIS — M329 Systemic lupus erythematosus, unspecified: Secondary | ICD-10-CM | POA: Diagnosis not present

## 2020-03-18 DIAGNOSIS — I129 Hypertensive chronic kidney disease with stage 1 through stage 4 chronic kidney disease, or unspecified chronic kidney disease: Secondary | ICD-10-CM | POA: Diagnosis not present

## 2020-03-18 DIAGNOSIS — K219 Gastro-esophageal reflux disease without esophagitis: Secondary | ICD-10-CM | POA: Diagnosis not present

## 2020-03-18 DIAGNOSIS — E7849 Other hyperlipidemia: Secondary | ICD-10-CM | POA: Diagnosis not present

## 2020-03-18 DIAGNOSIS — N183 Chronic kidney disease, stage 3 unspecified: Secondary | ICD-10-CM | POA: Diagnosis not present

## 2020-03-31 DIAGNOSIS — I69359 Hemiplegia and hemiparesis following cerebral infarction affecting unspecified side: Secondary | ICD-10-CM | POA: Diagnosis not present

## 2020-03-31 DIAGNOSIS — Z789 Other specified health status: Secondary | ICD-10-CM | POA: Diagnosis not present

## 2020-03-31 DIAGNOSIS — N183 Chronic kidney disease, stage 3 unspecified: Secondary | ICD-10-CM | POA: Diagnosis not present

## 2020-03-31 DIAGNOSIS — I10 Essential (primary) hypertension: Secondary | ICD-10-CM | POA: Diagnosis not present

## 2020-03-31 DIAGNOSIS — Z299 Encounter for prophylactic measures, unspecified: Secondary | ICD-10-CM | POA: Diagnosis not present

## 2020-03-31 DIAGNOSIS — J019 Acute sinusitis, unspecified: Secondary | ICD-10-CM | POA: Diagnosis not present

## 2020-05-09 DIAGNOSIS — H353122 Nonexudative age-related macular degeneration, left eye, intermediate dry stage: Secondary | ICD-10-CM | POA: Diagnosis not present

## 2020-06-03 NOTE — Progress Notes (Deleted)
Office Visit Note  Patient: Sheila Oliver             Date of Birth: 10-22-1940           MRN: [id number]             PCP: Monico Blitz, MD Referring: Monico Blitz, MD Visit Date: 06/16/2020 Occupation: @GUAROCC @  Subjective:    History of Present Illness: Sheila Oliver is a 80 y.o. female with history of osteoarthritis and DDD.   Activities of Daily Living:  Patient reports morning stiffness for *** {minute/hour:19697}.   Patient {ACTIONS;DENIES/REPORTS:21021675::"Denies"} nocturnal pain.  Difficulty dressing/grooming: {ACTIONS;DENIES/REPORTS:21021675::"Denies"} Difficulty climbing stairs: {ACTIONS;DENIES/REPORTS:21021675::"Denies"} Difficulty getting out of chair: {ACTIONS;DENIES/REPORTS:21021675::"Denies"} Difficulty using hands for taps, buttons, cutlery, and/or writing: {ACTIONS;DENIES/REPORTS:21021675::"Denies"}  No Rheumatology ROS completed.   PMFS History:  Patient Active Problem List   Diagnosis Date Noted  . External hemorrhoid 09/04/2017  . Chronic diarrhea 06/20/2016  . Osteopenia of multiple sites 05/15/2016  . Chronic pain syndrome 05/15/2016  . Primary insomnia 05/15/2016  . History of gastroesophageal reflux (GERD) 05/15/2016  . ANA positive 05/07/2016  . DJD (degenerative joint disease), cervical 05/07/2016  . Spondylosis of lumbar region without myelopathy or radiculopathy 05/07/2016  . Primary osteoarthritis of both hands 05/07/2016    Past Medical History:  Diagnosis Date  . C. difficile diarrhea   . DDD (degenerative disc disease), lumbar   . GERD (gastroesophageal reflux disease)   . Macular degeneration   . Osteoporosis   . Scoliosis   . Stroke ([place]) 04/19/2019    No family history on file. Past Surgical History:  Procedure Laterality Date  . CATARACT EXTRACTION Left   . CERVICAL FUSION    . COLONOSCOPY WITH PROPOFOL N/A 05/31/2016   Dr. Gala Romney: sigmoid and descending colon diverticulosis, normal colonic biopsies  .  ESOPHAGOGASTRODUODENOSCOPY (EGD) WITH PROPOFOL N/A 05/31/2016   Dr. Gala Romney: LA Grade A esophagitis, s/p dilation, erosive gastropathy, multiple benign gastric polyps, normal duodenum  . repair of rectal fissure    . SUPERFICIAL KERATECTOMY Bilateral    Social History   Social History Narrative  . Not on file   Immunization History  Administered Date(s) Administered  . Moderna Sars-Covid-2 Vaccination 04/23/2019, 05/22/2019     Objective: Vital Signs: There were no vitals taken for this visit.   Physical Exam Vitals and nursing note reviewed.  Constitutional:      Appearance: She is well-developed.  HENT:     Head: Normocephalic and atraumatic.  Eyes:     Conjunctiva/sclera: Conjunctivae normal.  Pulmonary:     Effort: Pulmonary effort is normal.  Abdominal:     Palpations: Abdomen is soft.  Musculoskeletal:     Cervical back: Normal range of motion.  Skin:    General: Skin is warm and dry.     Capillary Refill: Capillary refill takes less than 2 seconds.  Neurological:     Mental Status: She is alert and oriented to person, place, and time.  Psychiatric:        Behavior: Behavior normal.      Musculoskeletal Exam: ***  CDAI Exam: CDAI Score: -- Patient Global: --; Provider Global: -- Swollen: --; Tender: -- Joint Exam 06/16/2020   No joint exam has been documented for this visit   There is currently no information documented on the homunculus. Go to the Rheumatology activity and complete the homunculus joint exam.  Investigation: No additional findings.  Imaging: No results found.  Recent Labs: Lab Results  Component Value Date  WBC 4.9 06/18/2019   HGB 14.2 06/18/2019   PLT 205 06/18/2019   NA 139 06/25/2019   K 4.1 06/25/2019   CL 105 06/25/2019   CO2 26 06/25/2019   GLUCOSE 101 (H) 06/25/2019   BUN 17 06/25/2019   CREATININE 1.05 (H) 06/25/2019   BILITOT 1.5 (H) 06/18/2019   ALKPHOS 66 06/25/2016   AST 11 06/18/2019   ALT 14 06/18/2019    PROT 6.7 06/18/2019   ALBUMIN 4.1 06/25/2016   CALCIUM 9.4 06/25/2019   GFRAA 60 06/25/2019    Speciality Comments: No specialty comments available.  Procedures:  No procedures performed Allergies: Cortisone   Assessment / Plan:     Visit Diagnoses: ANA positive  Ds DNA antibody positive  Primary osteoarthritis of both hands  Swelling of left knee joint  Primary osteoarthritis of both knees  Trochanteric bursitis of left hip  DDD (degenerative disc disease), cervical  DDD (degenerative disc disease), lumbar  Osteopenia of multiple sites  Chronic pain syndrome  Primary insomnia  History of gastroesophageal reflux (GERD)  History of stroke  Elevated serum creatinine  Orders: No orders of the defined types were placed in this encounter.  No orders of the defined types were placed in this encounter.   Face-to-face time spent with patient was *** minutes. Greater than 50% of time was spent in counseling and coordination of care.  Follow-Up Instructions: No follow-ups on file.   Ofilia Neas, PA-C  Note - This record has been created using Dragon software.  Chart creation errors have been sought, but may not always  have been located. Such creation errors do not reflect on  the standard of medical care.

## 2020-06-10 DIAGNOSIS — I69359 Hemiplegia and hemiparesis following cerebral infarction affecting unspecified side: Secondary | ICD-10-CM | POA: Diagnosis not present

## 2020-06-10 DIAGNOSIS — Z299 Encounter for prophylactic measures, unspecified: Secondary | ICD-10-CM | POA: Diagnosis not present

## 2020-06-10 DIAGNOSIS — Z681 Body mass index (BMI) 19 or less, adult: Secondary | ICD-10-CM | POA: Diagnosis not present

## 2020-06-10 DIAGNOSIS — N183 Chronic kidney disease, stage 3 unspecified: Secondary | ICD-10-CM | POA: Diagnosis not present

## 2020-06-10 DIAGNOSIS — R059 Cough, unspecified: Secondary | ICD-10-CM | POA: Diagnosis not present

## 2020-06-10 DIAGNOSIS — I10 Essential (primary) hypertension: Secondary | ICD-10-CM | POA: Diagnosis not present

## 2020-06-14 DIAGNOSIS — R059 Cough, unspecified: Secondary | ICD-10-CM | POA: Diagnosis not present

## 2020-06-15 DIAGNOSIS — K219 Gastro-esophageal reflux disease without esophagitis: Secondary | ICD-10-CM | POA: Diagnosis not present

## 2020-06-15 DIAGNOSIS — E7849 Other hyperlipidemia: Secondary | ICD-10-CM | POA: Diagnosis not present

## 2020-06-15 DIAGNOSIS — N183 Chronic kidney disease, stage 3 unspecified: Secondary | ICD-10-CM | POA: Diagnosis not present

## 2020-06-15 DIAGNOSIS — I10 Essential (primary) hypertension: Secondary | ICD-10-CM | POA: Diagnosis not present

## 2020-06-16 ENCOUNTER — Ambulatory Visit: Payer: Medicare Other | Admitting: Physician Assistant

## 2020-06-16 DIAGNOSIS — Z8719 Personal history of other diseases of the digestive system: Secondary | ICD-10-CM

## 2020-06-16 DIAGNOSIS — M19011 Primary osteoarthritis, right shoulder: Secondary | ICD-10-CM

## 2020-06-16 DIAGNOSIS — M7062 Trochanteric bursitis, left hip: Secondary | ICD-10-CM

## 2020-06-16 DIAGNOSIS — Z8673 Personal history of transient ischemic attack (TIA), and cerebral infarction without residual deficits: Secondary | ICD-10-CM

## 2020-06-16 DIAGNOSIS — G894 Chronic pain syndrome: Secondary | ICD-10-CM

## 2020-06-16 DIAGNOSIS — R768 Other specified abnormal immunological findings in serum: Secondary | ICD-10-CM

## 2020-06-16 DIAGNOSIS — M8589 Other specified disorders of bone density and structure, multiple sites: Secondary | ICD-10-CM

## 2020-06-16 DIAGNOSIS — M5030 Other cervical disc degeneration, unspecified cervical region: Secondary | ICD-10-CM

## 2020-06-16 DIAGNOSIS — M170 Bilateral primary osteoarthritis of knee: Secondary | ICD-10-CM

## 2020-06-16 DIAGNOSIS — F5101 Primary insomnia: Secondary | ICD-10-CM

## 2020-06-16 DIAGNOSIS — M5136 Other intervertebral disc degeneration, lumbar region: Secondary | ICD-10-CM

## 2020-06-16 DIAGNOSIS — M25462 Effusion, left knee: Secondary | ICD-10-CM

## 2020-06-16 DIAGNOSIS — M19041 Primary osteoarthritis, right hand: Secondary | ICD-10-CM

## 2020-06-16 DIAGNOSIS — R7989 Other specified abnormal findings of blood chemistry: Secondary | ICD-10-CM

## 2020-07-08 NOTE — Progress Notes (Signed)
Office Visit Note  Patient: Sheila Oliver             Date of Birth: 1940/10/09           MRN: [id number]             PCP: Monico Blitz, MD Referring: Monico Blitz, MD Visit Date: 07/21/2020 Occupation: @GUAROCC @  Subjective:  Other (Bilateral hand pain )   History of Present Illness: Sheila Oliver is a 80 y.o. female with a history of osteoarthritis and positive double-stranded DNA.  She states she continues to have some pain and discomfort in her hands due to underlying osteoarthritis.  She also had a fall last year and had knee joint discomfort which is resolved now.  She states she had a stroke in 2021 and have left-sided hemiparesis.  She denies any history of oral ulcers, nasal ulcers, malar rash, photosensitivity, sicca symptoms, Raynaud's phenomenon or lymphadenopathy.  She continues to have some generalized pain in her muscles.  She also complains of fatigue.  Activities of Daily Living:  Patient reports morning stiffness for 0 minutes.   Patient Reports nocturnal pain.  Difficulty dressing/grooming: Denies Difficulty climbing stairs: Denies Difficulty getting out of chair: Denies Difficulty using hands for taps, buttons, cutlery, and/or writing: Reports  Review of Systems  Constitutional: Positive for fatigue. Negative for night sweats, weight gain and weight loss.  HENT: Negative for mouth sores, trouble swallowing, trouble swallowing, mouth dryness and nose dryness.   Eyes: Negative for pain, redness, itching, visual disturbance and dryness.  Respiratory: Negative for cough, shortness of breath and difficulty breathing.   Cardiovascular: Negative for chest pain, palpitations, hypertension, irregular heartbeat and swelling in legs/feet.  Gastrointestinal: Negative for blood in stool, constipation and diarrhea.  Endocrine: Negative for increased urination.  Genitourinary: Negative for difficulty urinating and vaginal dryness.  Musculoskeletal: Positive for arthralgias,  joint pain, myalgias, muscle tenderness and myalgias. Negative for joint swelling, muscle weakness and morning stiffness.  Skin: Negative for color change, rash, hair loss, redness, skin tightness, ulcers and sensitivity to sunlight.  Allergic/Immunologic: Negative for susceptible to infections.  Neurological: Positive for weakness. Negative for dizziness, numbness, headaches, memory loss and night sweats.       Left sided weakness  Hematological: Positive for bruising/bleeding tendency. Negative for swollen glands.  Psychiatric/Behavioral: Negative for depressed mood, confusion and sleep disturbance. The patient is not nervous/anxious.     PMFS History:  Patient Active Problem List   Diagnosis Date Noted  . External hemorrhoid 09/04/2017  . Chronic diarrhea 06/20/2016  . Osteopenia of multiple sites 05/15/2016  . Chronic pain syndrome 05/15/2016  . Primary insomnia 05/15/2016  . History of gastroesophageal reflux (GERD) 05/15/2016  . ANA positive 05/07/2016  . DJD (degenerative joint disease), cervical 05/07/2016  . Spondylosis of lumbar region without myelopathy or radiculopathy 05/07/2016  . Primary osteoarthritis of both hands 05/07/2016    Past Medical History:  Diagnosis Date  . C. difficile diarrhea   . DDD (degenerative disc disease), lumbar   . GERD (gastroesophageal reflux disease)   . Macular degeneration   . Osteoporosis   . Scoliosis   . Stroke [facility]) 04/19/2019    History reviewed. No pertinent family history. Past Surgical History:  Procedure Laterality Date  . CATARACT EXTRACTION Left   . CERVICAL FUSION    . COLONOSCOPY WITH PROPOFOL N/A 05/31/2016   Dr. Gala Romney: sigmoid and descending colon diverticulosis, normal colonic biopsies  . ESOPHAGOGASTRODUODENOSCOPY (EGD) WITH PROPOFOL N/A 05/31/2016  Dr. Gala Romney: LA Grade A esophagitis, s/p dilation, erosive gastropathy, multiple benign gastric polyps, normal duodenum  . repair of rectal fissure    . SUPERFICIAL  KERATECTOMY Bilateral    Social History   Social History Narrative  . Not on file   Immunization History  Administered Date(s) Administered  . Moderna Sars-Covid-2 Vaccination 04/23/2019, 05/22/2019, 02/02/2020     Objective: Vital Signs: BP 112/67 (BP Location: Left Arm, Patient Position: Sitting, Cuff Size: Normal)   Pulse 81   Resp 14   Ht 5\' 5"  (1.651 m)   Wt 114 lb 12.8 oz (52.1 kg)   BMI 19.10 kg/m    Physical Exam Vitals and nursing note reviewed.  Constitutional:      Appearance: She is well-developed.  HENT:     Head: Normocephalic and atraumatic.  Eyes:     Conjunctiva/sclera: Conjunctivae normal.  Cardiovascular:     Rate and Rhythm: Normal rate and regular rhythm.     Heart sounds: Normal heart sounds.  Pulmonary:     Effort: Pulmonary effort is normal.     Breath sounds: Normal breath sounds.  Abdominal:     General: Bowel sounds are normal.     Palpations: Abdomen is soft.  Musculoskeletal:     Cervical back: Normal range of motion.  Lymphadenopathy:     Cervical: No cervical adenopathy.  Skin:    General: Skin is warm and dry.     Capillary Refill: Capillary refill takes less than 2 seconds.  Neurological:     Mental Status: She is alert and oriented to person, place, and time.  Psychiatric:        Behavior: Behavior normal.      Musculoskeletal Exam: She had limited range of motion of her cervical spine.  Shoulder joints, elbow joints, wrist joints with good range of motion.  She had bilateral PIP and DIP thickening with no synovitis.  Hip joints and knee joints were in good range of motion.  No warmth swelling effusion was noted.  There was no tenderness over ankles or MTPs.  CDAI Exam: CDAI Score: -- Patient Global: --; Provider Global: -- Swollen: --; Tender: -- Joint Exam 07/21/2020   No joint exam has been documented for this visit   There is currently no information documented on the homunculus. Go to the Rheumatology activity and  complete the homunculus joint exam.  Investigation: No additional findings.  Imaging: No results found.  Recent Labs: Lab Results  Component Value Date   WBC 4.9 06/18/2019   HGB 14.2 06/18/2019   PLT 205 06/18/2019   NA 139 06/25/2019   K 4.1 06/25/2019   CL 105 06/25/2019   CO2 26 06/25/2019   GLUCOSE 101 (H) 06/25/2019   BUN 17 06/25/2019   CREATININE 1.05 (H) 06/25/2019   BILITOT 1.5 (H) 06/18/2019   ALKPHOS 66 06/25/2016   AST 11 06/18/2019   ALT 14 06/18/2019   PROT 6.7 06/18/2019   ALBUMIN 4.1 06/25/2016   CALCIUM 9.4 06/25/2019   GFRAA 60 06/25/2019    Speciality Comments: No specialty comments available.  Procedures:  No procedures performed Allergies: Cortisone   Assessment / Plan:     Visit Diagnoses: ANA positive - in the past.  Her ANA was negative later  Ds DNA antibody positive - significantly positive double-stranded DNA.  She also has beta-2 anticardiolipin IgG positive.  Complements are normal and sed rate is normal.  She has no clinical features of autoimmune disease.  She denies any  history of oral ulcers, nasal ulcers, malar rash, photosensitivity, Raynaud's phenomenon, sicca symptoms or lymphadenopathy.  I will obtain AV[facility] labs.  If her labs come positive we will contact her.  Primary osteoarthritis of both hands-she has severe osteoarthritis in her hands.  Joint protection muscle strengthening was discussed.  Swelling of left knee joint -patient reports that she developed swelling in her left knee joint after a fall.  She has had no recurrence of swelling.  She had left knee joint aspiration by her PCP.   Primary osteoarthritis of both knees-she continues to have some chronic discomfort in her knee joints.  Muscle strengthening exercises were discussed.  Trochanteric bursitis of left hip-resolved.  DDD (degenerative disc disease), cervical-she has limited range of motion of her cervical spine.  DDD (degenerative disc disease), lumbar-she has  limited range of motion and discomfort in her lower back.  Core strengthening exercises were discussed.  Chronic pain of both shoulders - x-ray showed acromioclavicular arthritis.   Osteopenia of multiple sites - She has had DEXA with her PCP.  Chronic pain syndrome-she continues to have some generalized pain and discomfort.  She describes pain and discomfort in her rib cage and also her arms.  Primary insomnia-good sleep hygiene was discussed.  History of gastroesophageal reflux (GERD)  History of stroke-she had a stroke in 2020.  She developed left-sided hemiparesis.  Orders: No orders of the defined types were placed in this encounter.  No orders of the defined types were placed in this encounter.    Follow-Up Instructions: Return for Osteoarthritis.   Bo Merino, MD  Note - This record has been created using Editor, commissioning.  Chart creation errors have been sought, but may not always  have been located. Such creation errors do not reflect on  the standard of medical care.

## 2020-07-14 DIAGNOSIS — N183 Chronic kidney disease, stage 3 unspecified: Secondary | ICD-10-CM | POA: Diagnosis not present

## 2020-07-14 DIAGNOSIS — Z299 Encounter for prophylactic measures, unspecified: Secondary | ICD-10-CM | POA: Diagnosis not present

## 2020-07-14 DIAGNOSIS — Z681 Body mass index (BMI) 19 or less, adult: Secondary | ICD-10-CM | POA: Diagnosis not present

## 2020-07-14 DIAGNOSIS — M329 Systemic lupus erythematosus, unspecified: Secondary | ICD-10-CM | POA: Diagnosis not present

## 2020-07-14 DIAGNOSIS — I69359 Hemiplegia and hemiparesis following cerebral infarction affecting unspecified side: Secondary | ICD-10-CM | POA: Diagnosis not present

## 2020-07-14 DIAGNOSIS — I10 Essential (primary) hypertension: Secondary | ICD-10-CM | POA: Diagnosis not present

## 2020-07-21 ENCOUNTER — Ambulatory Visit (INDEPENDENT_AMBULATORY_CARE_PROVIDER_SITE_OTHER): Payer: Medicare Other | Admitting: Rheumatology

## 2020-07-21 ENCOUNTER — Encounter: Payer: Self-pay | Admitting: Rheumatology

## 2020-07-21 ENCOUNTER — Other Ambulatory Visit: Payer: Self-pay

## 2020-07-21 VITALS — BP 112/67 | HR 81 | Resp 14 | Ht 65.0 in | Wt 114.8 lb

## 2020-07-21 DIAGNOSIS — M7062 Trochanteric bursitis, left hip: Secondary | ICD-10-CM

## 2020-07-21 DIAGNOSIS — M25511 Pain in right shoulder: Secondary | ICD-10-CM

## 2020-07-21 DIAGNOSIS — M25462 Effusion, left knee: Secondary | ICD-10-CM

## 2020-07-21 DIAGNOSIS — Z8673 Personal history of transient ischemic attack (TIA), and cerebral infarction without residual deficits: Secondary | ICD-10-CM

## 2020-07-21 DIAGNOSIS — M19042 Primary osteoarthritis, left hand: Secondary | ICD-10-CM

## 2020-07-21 DIAGNOSIS — R768 Other specified abnormal immunological findings in serum: Secondary | ICD-10-CM

## 2020-07-21 DIAGNOSIS — M170 Bilateral primary osteoarthritis of knee: Secondary | ICD-10-CM | POA: Diagnosis not present

## 2020-07-21 DIAGNOSIS — M19041 Primary osteoarthritis, right hand: Secondary | ICD-10-CM

## 2020-07-21 DIAGNOSIS — M8589 Other specified disorders of bone density and structure, multiple sites: Secondary | ICD-10-CM | POA: Diagnosis not present

## 2020-07-21 DIAGNOSIS — R7989 Other specified abnormal findings of blood chemistry: Secondary | ICD-10-CM

## 2020-07-21 DIAGNOSIS — M5030 Other cervical disc degeneration, unspecified cervical region: Secondary | ICD-10-CM

## 2020-07-21 DIAGNOSIS — G894 Chronic pain syndrome: Secondary | ICD-10-CM | POA: Diagnosis not present

## 2020-07-21 DIAGNOSIS — M5136 Other intervertebral disc degeneration, lumbar region: Secondary | ICD-10-CM

## 2020-07-21 DIAGNOSIS — F5101 Primary insomnia: Secondary | ICD-10-CM

## 2020-07-21 DIAGNOSIS — M25512 Pain in left shoulder: Secondary | ICD-10-CM

## 2020-07-21 DIAGNOSIS — G8929 Other chronic pain: Secondary | ICD-10-CM

## 2020-07-21 DIAGNOSIS — Z8719 Personal history of other diseases of the digestive system: Secondary | ICD-10-CM

## 2020-07-21 DIAGNOSIS — M51369 Other intervertebral disc degeneration, lumbar region without mention of lumbar back pain or lower extremity pain: Secondary | ICD-10-CM

## 2020-07-26 ENCOUNTER — Encounter: Payer: Self-pay | Admitting: Rheumatology

## 2020-07-26 DIAGNOSIS — R768 Other specified abnormal immunological findings in serum: Secondary | ICD-10-CM | POA: Diagnosis not present

## 2020-07-26 DIAGNOSIS — D8989 Other specified disorders involving the immune mechanism, not elsewhere classified: Secondary | ICD-10-CM | POA: Diagnosis not present

## 2020-08-10 ENCOUNTER — Telehealth: Payer: Self-pay

## 2020-08-10 NOTE — Telephone Encounter (Signed)
Patient called to check if Dr. Estanislado Pandy received the results of her AV[facility] labs.  Patient requested a return call.

## 2020-08-10 NOTE — Telephone Encounter (Signed)
I called patient, labs in folder for Dr. Estanislado Pandy or Hazel Sams, PA-C to call patient

## 2020-08-12 DIAGNOSIS — H35373 Puckering of macula, bilateral: Secondary | ICD-10-CM | POA: Diagnosis not present

## 2020-08-12 DIAGNOSIS — H353132 Nonexudative age-related macular degeneration, bilateral, intermediate dry stage: Secondary | ICD-10-CM | POA: Diagnosis not present

## 2020-08-12 DIAGNOSIS — Z9842 Cataract extraction status, left eye: Secondary | ICD-10-CM | POA: Diagnosis not present

## 2020-08-12 DIAGNOSIS — H35363 Drusen (degenerative) of macula, bilateral: Secondary | ICD-10-CM | POA: Diagnosis not present

## 2020-08-12 DIAGNOSIS — H0288B Meibomian gland dysfunction left eye, upper and lower eyelids: Secondary | ICD-10-CM | POA: Diagnosis not present

## 2020-08-12 DIAGNOSIS — H04123 Dry eye syndrome of bilateral lacrimal glands: Secondary | ICD-10-CM | POA: Diagnosis not present

## 2020-08-12 DIAGNOSIS — H1045 Other chronic allergic conjunctivitis: Secondary | ICD-10-CM | POA: Diagnosis not present

## 2020-08-12 DIAGNOSIS — H26491 Other secondary cataract, right eye: Secondary | ICD-10-CM | POA: Diagnosis not present

## 2020-08-12 DIAGNOSIS — Z961 Presence of intraocular lens: Secondary | ICD-10-CM | POA: Diagnosis not present

## 2020-08-12 DIAGNOSIS — H0288A Meibomian gland dysfunction right eye, upper and lower eyelids: Secondary | ICD-10-CM | POA: Diagnosis not present

## 2020-08-12 DIAGNOSIS — H17823 Peripheral opacity of cornea, bilateral: Secondary | ICD-10-CM | POA: Diagnosis not present

## 2020-08-16 ENCOUNTER — Telehealth: Payer: Self-pay

## 2020-08-16 NOTE — Telephone Encounter (Signed)
Patient called stating she left a message last week regarding her labwork results and hasn't received a return call from Dr. Estanislado Pandy.  Please advise.

## 2020-08-16 NOTE — Telephone Encounter (Signed)
Labs in folder, please advise.

## 2020-08-17 NOTE — Telephone Encounter (Signed)
I called patient, patient verbalized understanding. 

## 2020-08-17 NOTE — Telephone Encounter (Signed)
I tried calling patient's several times but I did not get a response.  Her answering machine is full.[facility] labs are all within normal limits except for beta-2 GP 1 antibody which was borderline positive and not a significant value.  This antibody can cause increased risk of clotting in some people.  I would repeat the test at the follow-up visit.

## 2020-09-01 DIAGNOSIS — H04123 Dry eye syndrome of bilateral lacrimal glands: Secondary | ICD-10-CM | POA: Diagnosis not present

## 2020-09-13 ENCOUNTER — Other Ambulatory Visit (HOSPITAL_COMMUNITY): Payer: Self-pay | Admitting: Internal Medicine

## 2020-09-13 DIAGNOSIS — Z1231 Encounter for screening mammogram for malignant neoplasm of breast: Secondary | ICD-10-CM

## 2020-09-15 DIAGNOSIS — I10 Essential (primary) hypertension: Secondary | ICD-10-CM | POA: Diagnosis not present

## 2020-09-15 DIAGNOSIS — E039 Hypothyroidism, unspecified: Secondary | ICD-10-CM | POA: Diagnosis not present

## 2020-09-15 DIAGNOSIS — E7800 Pure hypercholesterolemia, unspecified: Secondary | ICD-10-CM | POA: Diagnosis not present

## 2020-09-15 DIAGNOSIS — F0390 Unspecified dementia without behavioral disturbance: Secondary | ICD-10-CM | POA: Diagnosis not present

## 2020-09-22 DIAGNOSIS — M5136 Other intervertebral disc degeneration, lumbar region: Secondary | ICD-10-CM | POA: Diagnosis not present

## 2020-09-22 DIAGNOSIS — M7062 Trochanteric bursitis, left hip: Secondary | ICD-10-CM | POA: Diagnosis not present

## 2020-09-22 DIAGNOSIS — M5459 Other low back pain: Secondary | ICD-10-CM | POA: Diagnosis not present

## 2020-09-22 DIAGNOSIS — G894 Chronic pain syndrome: Secondary | ICD-10-CM | POA: Diagnosis not present

## 2020-09-22 DIAGNOSIS — M4120 Other idiopathic scoliosis, site unspecified: Secondary | ICD-10-CM | POA: Diagnosis not present

## 2020-09-22 DIAGNOSIS — G5600 Carpal tunnel syndrome, unspecified upper limb: Secondary | ICD-10-CM | POA: Diagnosis not present

## 2020-09-22 DIAGNOSIS — M47816 Spondylosis without myelopathy or radiculopathy, lumbar region: Secondary | ICD-10-CM | POA: Diagnosis not present

## 2020-10-04 DIAGNOSIS — G894 Chronic pain syndrome: Secondary | ICD-10-CM | POA: Diagnosis not present

## 2020-10-04 DIAGNOSIS — G5602 Carpal tunnel syndrome, left upper limb: Secondary | ICD-10-CM | POA: Diagnosis not present

## 2020-10-13 ENCOUNTER — Ambulatory Visit (HOSPITAL_COMMUNITY): Payer: Medicare Other

## 2020-10-20 DIAGNOSIS — D692 Other nonthrombocytopenic purpura: Secondary | ICD-10-CM | POA: Diagnosis not present

## 2020-10-20 DIAGNOSIS — I10 Essential (primary) hypertension: Secondary | ICD-10-CM | POA: Diagnosis not present

## 2020-10-20 DIAGNOSIS — I69359 Hemiplegia and hemiparesis following cerebral infarction affecting unspecified side: Secondary | ICD-10-CM | POA: Diagnosis not present

## 2020-10-20 DIAGNOSIS — M329 Systemic lupus erythematosus, unspecified: Secondary | ICD-10-CM | POA: Diagnosis not present

## 2020-10-20 DIAGNOSIS — Z299 Encounter for prophylactic measures, unspecified: Secondary | ICD-10-CM | POA: Diagnosis not present

## 2020-10-24 ENCOUNTER — Ambulatory Visit (HOSPITAL_COMMUNITY): Payer: Medicare Other

## 2020-10-26 ENCOUNTER — Ambulatory Visit (HOSPITAL_COMMUNITY): Payer: Medicare Other

## 2020-11-03 ENCOUNTER — Other Ambulatory Visit: Payer: Self-pay

## 2020-11-03 ENCOUNTER — Ambulatory Visit (HOSPITAL_COMMUNITY)
Admission: RE | Admit: 2020-11-03 | Discharge: 2020-11-03 | Disposition: A | Discharge status: Home / Self Care | Payer: Medicare Other | Source: Ambulatory Visit | Attending: Internal Medicine | Admitting: Internal Medicine

## 2020-11-03 DIAGNOSIS — Z1231 Encounter for screening mammogram for malignant neoplasm of breast: Secondary | ICD-10-CM | POA: Insufficient documentation

## 2020-11-07 DIAGNOSIS — H353132 Nonexudative age-related macular degeneration, bilateral, intermediate dry stage: Secondary | ICD-10-CM | POA: Diagnosis not present

## 2020-11-15 DIAGNOSIS — G894 Chronic pain syndrome: Secondary | ICD-10-CM | POA: Diagnosis not present

## 2020-11-15 DIAGNOSIS — M47816 Spondylosis without myelopathy or radiculopathy, lumbar region: Secondary | ICD-10-CM | POA: Diagnosis not present

## 2020-11-15 DIAGNOSIS — G5602 Carpal tunnel syndrome, left upper limb: Secondary | ICD-10-CM | POA: Diagnosis not present

## 2020-11-16 DIAGNOSIS — E039 Hypothyroidism, unspecified: Secondary | ICD-10-CM | POA: Diagnosis not present

## 2020-11-16 DIAGNOSIS — I10 Essential (primary) hypertension: Secondary | ICD-10-CM | POA: Diagnosis not present

## 2020-11-16 DIAGNOSIS — E7800 Pure hypercholesterolemia, unspecified: Secondary | ICD-10-CM | POA: Diagnosis not present

## 2020-11-16 DIAGNOSIS — F0390 Unspecified dementia without behavioral disturbance: Secondary | ICD-10-CM | POA: Diagnosis not present

## 2020-12-20 DIAGNOSIS — Z7189 Other specified counseling: Secondary | ICD-10-CM | POA: Diagnosis not present

## 2020-12-20 DIAGNOSIS — Z1339 Encounter for screening examination for other mental health and behavioral disorders: Secondary | ICD-10-CM | POA: Diagnosis not present

## 2020-12-20 DIAGNOSIS — R5383 Other fatigue: Secondary | ICD-10-CM | POA: Diagnosis not present

## 2020-12-20 DIAGNOSIS — Z6820 Body mass index (BMI) 20.0-20.9, adult: Secondary | ICD-10-CM | POA: Diagnosis not present

## 2020-12-20 DIAGNOSIS — E7800 Pure hypercholesterolemia, unspecified: Secondary | ICD-10-CM | POA: Diagnosis not present

## 2020-12-20 DIAGNOSIS — Z0000 Encounter for general adult medical examination without abnormal findings: Secondary | ICD-10-CM | POA: Diagnosis not present

## 2020-12-20 DIAGNOSIS — Z1331 Encounter for screening for depression: Secondary | ICD-10-CM | POA: Diagnosis not present

## 2020-12-20 DIAGNOSIS — E559 Vitamin D deficiency, unspecified: Secondary | ICD-10-CM | POA: Diagnosis not present

## 2020-12-20 DIAGNOSIS — Z299 Encounter for prophylactic measures, unspecified: Secondary | ICD-10-CM | POA: Diagnosis not present

## 2020-12-20 DIAGNOSIS — I10 Essential (primary) hypertension: Secondary | ICD-10-CM | POA: Diagnosis not present

## 2020-12-20 DIAGNOSIS — Z79899 Other long term (current) drug therapy: Secondary | ICD-10-CM | POA: Diagnosis not present

## 2021-01-05 NOTE — Progress Notes (Signed)
Office Visit Note  Patient: Sheila Oliver             Date of Birth: [birth year]           MRN: [id number]             PCP: Monico Blitz, MD Referring: Monico Blitz, MD Visit Date: 01/19/2021 Occupation: @GUAROCC @  Subjective:  Pain in multiple joints.   History of Present Illness: Sheila Oliver is a 80 y.o. female with a history of osteoarthritis and degenerative disc disease.  She states she continues to have pain and discomfort in her bilateral hands and her knee joints.  She states she had left wrist joint injection by Dr. Skip Mayer  recently.  She still continues to have some discomfort in her left wrist.  She has chronic neck and lower back pain for which she has been followed by Dr. Skip Mayer.  Activities of Daily Living:  Patient reports morning stiffness for 0 minutes.   Patient Reports nocturnal pain.  Difficulty dressing/grooming: Denies Difficulty climbing stairs: Reports Difficulty getting out of chair: Denies Difficulty using hands for taps, buttons, cutlery, and/or writing: Reports  Review of Systems  Constitutional:  Negative for fatigue, night sweats, weight gain and weight loss.  HENT:  Positive for mouth dryness. Negative for mouth sores, trouble swallowing, trouble swallowing and nose dryness.   Eyes:  Positive for dryness. Negative for pain, redness, itching and visual disturbance.  Respiratory:  Negative for cough, shortness of breath and difficulty breathing.   Cardiovascular:  Negative for chest pain, palpitations, hypertension, irregular heartbeat and swelling in legs/feet.  Gastrointestinal:  Negative for blood in stool, constipation and diarrhea.  Endocrine: Negative for increased urination.  Genitourinary:  Negative for difficulty urinating and vaginal dryness.  Musculoskeletal:  Positive for joint pain, joint pain and joint swelling. Negative for myalgias, muscle weakness, morning stiffness, muscle tenderness and myalgias.  Skin:  Negative for color change,  rash, hair loss, redness, skin tightness, ulcers and sensitivity to sunlight.  Allergic/Immunologic: Negative for susceptible to infections.  Neurological:  Positive for numbness and headaches. Negative for dizziness, memory loss, night sweats and weakness.  Hematological:  Positive for bruising/bleeding tendency. Negative for swollen glands.  Psychiatric/Behavioral:  Negative for depressed mood, confusion and sleep disturbance. The patient is not nervous/anxious.    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 difficile diarrhea    DDD (degenerative disc disease), lumbar    GERD (gastroesophageal reflux disease)    Macular degeneration    Osteoporosis    Scoliosis    Stroke ([place]) 04/19/2019    History reviewed. No pertinent family history. Past Surgical History:  Procedure Laterality Date   CATARACT EXTRACTION Left    CERVICAL FUSION     COLONOSCOPY WITH PROPOFOL N/A 05/31/2016   Dr. Gala Romney: sigmoid and descending colon diverticulosis, normal colonic biopsies   ESOPHAGOGASTRODUODENOSCOPY (EGD) WITH PROPOFOL N/A 05/31/2016   Dr. Gala Romney: LA Grade A esophagitis, s/p dilation, erosive gastropathy, multiple benign gastric polyps, normal duodenum  repair of rectal fissure     SUPERFICIAL KERATECTOMY Bilateral    Social History   Social History Narrative   Not on file   Immunization History  Administered Date(s) Administered   Moderna Sars-Covid-2 Vaccination 04/23/2019, 05/22/2019,  02/02/2020     Objective: Vital Signs: BP 128/84 (BP Location: Left Arm, Patient Position: Sitting, Cuff Size: Normal)   Pulse 80   Ht 5\' 5"  (1.651 m)   Wt 122 lb 3.2 oz (55.4 kg)   BMI 20.34 kg/m    Physical Exam Vitals and nursing note reviewed.  Constitutional:      Appearance: She is well-developed.  HENT:     Head: Normocephalic and atraumatic.  Eyes:     Conjunctiva/sclera: Conjunctivae normal.  Cardiovascular:     Rate and Rhythm: Normal rate and regular rhythm.     Heart sounds: Normal heart sounds.  Pulmonary:     Effort: Pulmonary effort is normal.     Breath sounds: Normal breath sounds.  Abdominal:     General: Bowel sounds are normal.     Palpations: Abdomen is soft.  Musculoskeletal:     Cervical back: Normal range of motion.  Lymphadenopathy:     Cervical: No cervical adenopathy.  Skin:    General: Skin is warm and dry.     Capillary Refill: Capillary refill takes less than 2 seconds.  Neurological:     Mental Status: She is alert and oriented to person, place, and time.  Psychiatric:        Behavior: Behavior normal.     Musculoskeletal Exam: C-spine was in good range of motion.  She has discomfort range of motion of her lumbar spine.  Shoulder joints, elbow joints, wrist joints with good range of motion.  She had bilateral CMC, PIP and DIP thickening consistent with osteoarthritis.  Subluxation of several DIPs was noted.  Hip joints and knee joints with good range of motion.  There was no tenderness over ankles or MTP joints.  CDAI Exam: CDAI Score: -- Patient Global: --; Provider Global: -- Swollen: --; Tender: -- Joint Exam 01/19/2021   No joint exam has been documented for this visit   There is currently no information documented on the homunculus. Go to the Rheumatology activity and complete the homunculus joint exam.  Investigation: No additional findings.  Imaging: No results found.  Recent Labs: Lab Results  Component Value Date    WBC 4.9 06/18/2019   HGB 14.2 06/18/2019   PLT 205 06/18/2019   NA 139 06/25/2019   K 4.1 06/25/2019   CL 105 06/25/2019   CO2 26 06/25/2019   GLUCOSE 101 (H) 06/25/2019   BUN 17 06/25/2019   CREATININE 1.05 (H) 06/25/2019   BILITOT 1.5 (H) 06/18/2019   ALKPHOS 66 06/25/2016   AST 11 06/18/2019   ALT 14 06/18/2019   PROT 6.7 06/18/2019   ALBUMIN 4.1 06/25/2016   CALCIUM 9.4 06/25/2019   GFRAA 60 06/25/2019   Jul 26, 2020 AVISE plus index -2.5, ANA negative, ENA negative, CB CAP negative, Jo 1 negative, anticardiolipin negative, beta-2 GP 1 IgG low titer positive at 16, antiphosphatidylserine negative, antihistone negative, RF negative, anti-CCP negative, anti-caRP negative, antithyroglobulin negative, anti-TPO negative   Speciality Comments: No specialty comments available.  Procedures:  No procedures performed Allergies: Cortisone   Assessment / Plan:     Visit Diagnoses: ANA positive - in the past.  Her ANA was negative later.  She also had AVISE labs in May 2022 which showed  negative ANA and ENA was negative.  Beta-2 GP 1 IgG antibodies were low positive at 16.  I will repeat beta-2 GP 1 antibodies today.  We will call her with the results.  Primary osteoarthritis of both hands-she has severe osteoarthritis in her hands and has ongoing pain and discomfort.  She is subluxation of several of her DIP joints.  Joint protection muscle strengthening was discussed.  A handout on hand exercises was given.  Chronic pain of both shoulders - x-ray showed acromioclavicular arthritis.   Primary osteoarthritis of both knees-she continues to have some discomfort in her knee joints.  No warmth swelling or effusion was noted.  Trochanteric bursitis of left hip -she is off-and-on discomfort.  IT band stretches were discussed.  DDD (degenerative disc disease), cervical-she is some stiffness with range of motion of her cervical spine.  DDD (degenerative disc disease), lumbar -she has chronic  pain.  She is followed by Dr. Skip Mayer.  Osteopenia of multiple sites - She has had DEXA with her PCP.  Chronic pain syndrome-followed by Dr.Otuel.  She is on tramadol.  History of stroke - she had a stroke in 2020.  She developed left-sided hemiparesis. - Plan: Beta-2 glycoprotein antibodies  Chronic anticoagulation-she is on Plavix.  Primary insomnia-sleep hygiene was discussed.  History of gastroesophageal reflux (GERD)-she is on pantoprazole.  Orders: Orders Placed This Encounter  Procedures   Beta-2 glycoprotein antibodies   No orders of the defined types were placed in this encounter.    Follow-Up Instructions: Return in about 1 year (around 01/19/2022) for Osteoarthritis.   Bo Merino, MD  Note - This record has been created using Editor, commissioning.  Chart creation errors have been sought, but may not always  have been located. Such creation errors do not reflect on  the standard of medical care.

## 2021-01-09 DIAGNOSIS — E2839 Other primary ovarian failure: Secondary | ICD-10-CM | POA: Diagnosis not present

## 2021-01-19 ENCOUNTER — Other Ambulatory Visit: Payer: Self-pay

## 2021-01-19 ENCOUNTER — Encounter: Payer: Self-pay | Admitting: Rheumatology

## 2021-01-19 ENCOUNTER — Ambulatory Visit (INDEPENDENT_AMBULATORY_CARE_PROVIDER_SITE_OTHER): Payer: Medicare Other | Admitting: Rheumatology

## 2021-01-19 VITALS — BP 128/84 | HR 80 | Ht 65.0 in | Wt 122.2 lb

## 2021-01-19 DIAGNOSIS — M170 Bilateral primary osteoarthritis of knee: Secondary | ICD-10-CM

## 2021-01-19 DIAGNOSIS — M7062 Trochanteric bursitis, left hip: Secondary | ICD-10-CM

## 2021-01-19 DIAGNOSIS — R768 Other specified abnormal immunological findings in serum: Secondary | ICD-10-CM | POA: Diagnosis not present

## 2021-01-19 DIAGNOSIS — G894 Chronic pain syndrome: Secondary | ICD-10-CM | POA: Diagnosis not present

## 2021-01-19 DIAGNOSIS — Z8719 Personal history of other diseases of the digestive system: Secondary | ICD-10-CM | POA: Diagnosis not present

## 2021-01-19 DIAGNOSIS — M5030 Other cervical disc degeneration, unspecified cervical region: Secondary | ICD-10-CM

## 2021-01-19 DIAGNOSIS — M19041 Primary osteoarthritis, right hand: Secondary | ICD-10-CM

## 2021-01-19 DIAGNOSIS — F5101 Primary insomnia: Secondary | ICD-10-CM

## 2021-01-19 DIAGNOSIS — M5136 Other intervertebral disc degeneration, lumbar region: Secondary | ICD-10-CM

## 2021-01-19 DIAGNOSIS — M25511 Pain in right shoulder: Secondary | ICD-10-CM

## 2021-01-19 DIAGNOSIS — Z7901 Long term (current) use of anticoagulants: Secondary | ICD-10-CM

## 2021-01-19 DIAGNOSIS — Z8673 Personal history of transient ischemic attack (TIA), and cerebral infarction without residual deficits: Secondary | ICD-10-CM

## 2021-01-19 DIAGNOSIS — M8589 Other specified disorders of bone density and structure, multiple sites: Secondary | ICD-10-CM | POA: Diagnosis not present

## 2021-01-19 DIAGNOSIS — G8929 Other chronic pain: Secondary | ICD-10-CM

## 2021-01-19 DIAGNOSIS — M25512 Pain in left shoulder: Secondary | ICD-10-CM

## 2021-01-19 DIAGNOSIS — M25462 Effusion, left knee: Secondary | ICD-10-CM

## 2021-01-19 DIAGNOSIS — M19042 Primary osteoarthritis, left hand: Secondary | ICD-10-CM

## 2021-01-19 NOTE — Patient Instructions (Signed)
Hand Exercises Hand exercises can be helpful for almost anyone. These exercises can strengthen the hands, improve flexibility and movement, and increase blood flow to the hands. These results can make work and daily tasks easier. Hand exercises can be especially helpful for people who have joint pain from arthritis or have nerve damage from overuse (carpal tunnel syndrome). These exercises can also help people who have injured a hand. Exercises Most of these hand exercises are gentle stretching and motion exercises. It is usually safe to do them often throughout the day. Warming up your hands before exercise may help to reduce stiffness. You can do this with gentle massage or by placing your hands in warm water for 10-15 minutes. It is normal to feel some stretching, pulling, tightness, or mild discomfort as you begin new exercises. This will gradually improve. Stop an exercise right away if you feel sudden, severe pain or your pain gets worse. Ask your health care provider which exercises are best for you. Knuckle bend or "claw" fist  Stand or sit with your arm, hand, and all five fingers pointed straight up. Make sure to keep your wrist straight during the exercise. Gently bend your fingers down toward your palm until the tips of your fingers are touching the top of your palm. Keep your big knuckle straight and just bend the small knuckles in your fingers. Hold this position for __________ seconds. Straighten (extend) your fingers back to the starting position. Repeat this exercise 5-10 times with each hand. Full finger fist  Stand or sit with your arm, hand, and all five fingers pointed straight up. Make sure to keep your wrist straight during the exercise. Gently bend your fingers into your palm until the tips of your fingers are touching the middle of your palm. Hold this position for __________ seconds. Extend your fingers back to the starting position, stretching every joint fully. Repeat  this exercise 5-10 times with each hand. Straight fist Stand or sit with your arm, hand, and all five fingers pointed straight up. Make sure to keep your wrist straight during the exercise. Gently bend your fingers at the big knuckle, where your fingers meet your hand, and the middle knuckle. Keep the knuckle at the tips of your fingers straight and try to touch the bottom of your palm. Hold this position for __________ seconds. Extend your fingers back to the starting position, stretching every joint fully. Repeat this exercise 5-10 times with each hand. Tabletop  Stand or sit with your arm, hand, and all five fingers pointed straight up. Make sure to keep your wrist straight during the exercise. Gently bend your fingers at the big knuckle, where your fingers meet your hand, as far down as you can while keeping the small knuckles in your fingers straight. Think of forming a tabletop with your fingers. Hold this position for __________ seconds. Extend your fingers back to the starting position, stretching every joint fully. Repeat this exercise 5-10 times with each hand. Finger spread  Place your hand flat on a table with your palm facing down. Make sure your wrist stays straight as you do this exercise. Spread your fingers and thumb apart from each other as far as you can until you feel a gentle stretch. Hold this position for __________ seconds. Bring your fingers and thumb tight together again. Hold this position for __________ seconds. Repeat this exercise 5-10 times with each hand. Making circles  Stand or sit with your arm, hand, and all five fingers pointed   straight up. Make sure to keep your wrist straight during the exercise. Make a circle by touching the tip of your thumb to the tip of your index finger. Hold for __________ seconds. Then open your hand wide. Repeat this motion with your thumb and each finger on your hand. Repeat this exercise 5-10 times with each hand. Thumb  motion  Sit with your forearm resting on a table and your wrist straight. Your thumb should be facing up toward the ceiling. Keep your fingers relaxed as you move your thumb. Lift your thumb up as high as you can toward the ceiling. Hold for __________ seconds. Bend your thumb across your palm as far as you can, reaching the tip of your thumb for the small finger (pinkie) side of your palm. Hold for __________ seconds. Repeat this exercise 5-10 times with each hand. Grip strengthening  Hold a stress ball or other soft ball in the middle of your hand. Slowly increase the pressure, squeezing the ball as much as you can without causing pain. Think of bringing the tips of your fingers into the middle of your palm. All of your finger joints should bend when doing this exercise. Hold your squeeze for __________ seconds, then relax. Repeat this exercise 5-10 times with each hand. Contact a health care provider if: Your hand pain or discomfort gets much worse when you do an exercise. Your hand pain or discomfort does not improve within 2 hours after you exercise. If you have any of these problems, stop doing these exercises right away. Do not do them again unless your health care provider says that you can. Get help right away if: You develop sudden, severe hand pain or swelling. If this happens, stop doing these exercises right away. Do not do them again unless your health care provider says that you can. This information is not intended to replace advice given to you by your health care provider. Make sure you discuss any questions you have with your health care provider. Document Revised: 06/23/2020 Document Reviewed: 06/23/2020 Elsevier Patient Education  2022 [employer]  

## 2021-01-20 LAB — BETA-2 GLYCOPROTEIN ANTIBODIES
Beta-2 Glyco 1 IgA: 10.7 U/mL
Beta-2 Glyco 1 IgM: <2 U/mL
Beta-2 Glyco I IgG: 5 U/mL

## 2021-01-21 NOTE — Progress Notes (Signed)
Antibody test for beta-2 GP 1 (which increases the risk of clotting) is negative.  No change in treatment advised.

## 2021-02-20 DIAGNOSIS — M5136 Other intervertebral disc degeneration, lumbar region: Secondary | ICD-10-CM | POA: Diagnosis not present

## 2021-02-20 DIAGNOSIS — M5416 Radiculopathy, lumbar region: Secondary | ICD-10-CM | POA: Diagnosis not present

## 2021-02-20 DIAGNOSIS — M461 Sacroiliitis, not elsewhere classified: Secondary | ICD-10-CM | POA: Diagnosis not present

## 2021-03-14 DIAGNOSIS — M5136 Other intervertebral disc degeneration, lumbar region: Secondary | ICD-10-CM | POA: Diagnosis not present

## 2021-03-14 DIAGNOSIS — M48061 Spinal stenosis, lumbar region without neurogenic claudication: Secondary | ICD-10-CM | POA: Diagnosis not present

## 2021-03-14 DIAGNOSIS — M4726 Other spondylosis with radiculopathy, lumbar region: Secondary | ICD-10-CM | POA: Diagnosis not present

## 2021-03-22 DIAGNOSIS — Z789 Other specified health status: Secondary | ICD-10-CM | POA: Diagnosis not present

## 2021-03-22 DIAGNOSIS — Z6820 Body mass index (BMI) 20.0-20.9, adult: Secondary | ICD-10-CM | POA: Diagnosis not present

## 2021-03-22 DIAGNOSIS — Z299 Encounter for prophylactic measures, unspecified: Secondary | ICD-10-CM | POA: Diagnosis not present

## 2021-03-22 DIAGNOSIS — I10 Essential (primary) hypertension: Secondary | ICD-10-CM | POA: Diagnosis not present

## 2021-03-22 DIAGNOSIS — N183 Chronic kidney disease, stage 3 unspecified: Secondary | ICD-10-CM | POA: Diagnosis not present

## 2021-03-22 DIAGNOSIS — M545 Low back pain, unspecified: Secondary | ICD-10-CM | POA: Diagnosis not present

## 2021-04-27 DIAGNOSIS — M25561 Pain in right knee: Secondary | ICD-10-CM | POA: Diagnosis not present

## 2021-04-27 DIAGNOSIS — M25562 Pain in left knee: Secondary | ICD-10-CM | POA: Diagnosis not present

## 2021-04-27 DIAGNOSIS — Z79891 Long term (current) use of opiate analgesic: Secondary | ICD-10-CM | POA: Diagnosis not present

## 2021-04-27 DIAGNOSIS — G8929 Other chronic pain: Secondary | ICD-10-CM | POA: Diagnosis not present

## 2021-04-27 DIAGNOSIS — M48061 Spinal stenosis, lumbar region without neurogenic claudication: Secondary | ICD-10-CM | POA: Diagnosis not present

## 2021-04-27 DIAGNOSIS — M4726 Other spondylosis with radiculopathy, lumbar region: Secondary | ICD-10-CM | POA: Diagnosis not present

## 2021-04-27 DIAGNOSIS — M5136 Other intervertebral disc degeneration, lumbar region: Secondary | ICD-10-CM | POA: Diagnosis not present

## 2021-05-11 DIAGNOSIS — H353132 Nonexudative age-related macular degeneration, bilateral, intermediate dry stage: Secondary | ICD-10-CM | POA: Diagnosis not present

## 2021-06-27 DIAGNOSIS — H353190 Nonexudative age-related macular degeneration, unspecified eye, stage unspecified: Secondary | ICD-10-CM | POA: Diagnosis not present

## 2021-06-27 DIAGNOSIS — Z299 Encounter for prophylactic measures, unspecified: Secondary | ICD-10-CM | POA: Diagnosis not present

## 2021-06-27 DIAGNOSIS — Z6820 Body mass index (BMI) 20.0-20.9, adult: Secondary | ICD-10-CM | POA: Diagnosis not present

## 2021-06-27 DIAGNOSIS — B001 Herpesviral vesicular dermatitis: Secondary | ICD-10-CM | POA: Diagnosis not present

## 2021-06-27 DIAGNOSIS — I69359 Hemiplegia and hemiparesis following cerebral infarction affecting unspecified side: Secondary | ICD-10-CM | POA: Diagnosis not present

## 2021-06-27 DIAGNOSIS — I10 Essential (primary) hypertension: Secondary | ICD-10-CM | POA: Diagnosis not present

## 2021-07-11 DIAGNOSIS — H02035 Senile entropion of left lower eyelid: Secondary | ICD-10-CM | POA: Diagnosis not present

## 2021-07-11 DIAGNOSIS — H02032 Senile entropion of right lower eyelid: Secondary | ICD-10-CM | POA: Diagnosis not present

## 2021-07-11 DIAGNOSIS — H02535 Eyelid retraction left lower eyelid: Secondary | ICD-10-CM | POA: Diagnosis not present

## 2021-07-11 DIAGNOSIS — H02532 Eyelid retraction right lower eyelid: Secondary | ICD-10-CM | POA: Diagnosis not present

## 2021-07-11 DIAGNOSIS — H02052 Trichiasis without entropian right lower eyelid: Secondary | ICD-10-CM | POA: Diagnosis not present

## 2021-07-11 DIAGNOSIS — H02045 Spastic entropion of left lower eyelid: Secondary | ICD-10-CM | POA: Diagnosis not present

## 2021-07-11 DIAGNOSIS — H02042 Spastic entropion of right lower eyelid: Secondary | ICD-10-CM | POA: Diagnosis not present

## 2021-07-11 DIAGNOSIS — H02055 Trichiasis without entropian left lower eyelid: Secondary | ICD-10-CM | POA: Diagnosis not present

## 2021-07-12 NOTE — Progress Notes (Signed)
? ?Office Visit Note ? ?Patient: Sheila Oliver             ?Date of Birth: 08-19-1940           ?MRN: [id number]             ?PCP: Monico Blitz, MD ?Referring: Monico Blitz, MD ?Visit Date: 07/26/2021 ?Occupation: '@GUAROCC'$ @ ? ?Subjective:  ?Pain in multiple joints ? ?History of Present Illness: Sheila Oliver is a 81 y.o. female with history of osteoarthritis.  She continues to have pain and discomfort in her bilateral shoulders, bilateral hands, left trochanteric bursa and bilateral knee joints.  She does not notice any joint swelling.  She has been going to pain management.  She states she had an epidural injection in December 2022 which gave her some relief.  She continues to have discomfort in her knee joints since her fall 2 years ago.  She states she had x-rays of her knee joints at the pain management yesterday.  She denies any history of oral ulcers, nasal ulcers, malar rash, photosensitivity, Raynaud's phenomenon or lymphadenopathy. ? ?Activities of Daily Living:  ?Patient reports morning stiffness for 0 minutes.   ?Patient Denies nocturnal pain.  ?Difficulty dressing/grooming: Denies ?Difficulty climbing stairs: Denies ?Difficulty getting out of chair: Denies ?Difficulty using hands for taps, buttons, cutlery, and/or writing: Denies ? ?Review of Systems  ?Constitutional:  Positive for fatigue.  ?HENT:  Positive for mouth dryness. Negative for mouth sores and nose dryness.   ?Eyes:  Positive for dryness. Negative for pain and itching.  ?Respiratory:  Negative for shortness of breath and difficulty breathing.   ?Cardiovascular:  Negative for chest pain and palpitations.  ?Gastrointestinal:  Negative for blood in stool, constipation and diarrhea.  ?Endocrine: Negative for increased urination.  ?Genitourinary:  Negative for difficulty urinating.  ?Musculoskeletal:  Positive for joint pain and joint pain. Negative for joint swelling, myalgias, morning stiffness, muscle tenderness and myalgias.  ?Skin:  Negative  for color change, rash, redness and sensitivity to sunlight.  ?Allergic/Immunologic: Negative for susceptible to infections.  ?Neurological:  Positive for numbness and weakness. Negative for dizziness, headaches and memory loss.  ?Hematological:  Positive for bruising/bleeding tendency. Negative for swollen glands.  ?Psychiatric/Behavioral:  Positive for sleep disturbance. Negative for depressed mood and confusion. The patient is not nervous/anxious.   ? ?PMFS History:  ?Patient Active Problem List  ? Diagnosis Date Noted  ? External hemorrhoid 09/04/2017  ? Chronic diarrhea 06/20/2016  ? Osteopenia of multiple sites 05/15/2016  ? Chronic pain syndrome 05/15/2016  ? Primary insomnia 05/15/2016  ? History of gastroesophageal reflux (GERD) 05/15/2016  ? ANA positive 05/07/2016  ? DJD (degenerative joint disease), cervical 05/07/2016  ? Spondylosis of lumbar region without myelopathy or radiculopathy 05/07/2016  ? Primary osteoarthritis of both hands 05/07/2016  ?  ?Past Medical History:  ?Diagnosis Date  ? C. difficile diarrhea   ? DDD (degenerative disc disease), lumbar   ? GERD (gastroesophageal reflux disease)   ? Macular degeneration   ? Osteoporosis   ? Scoliosis   ? Stroke ([place]) 04/19/2019  ?  ?History reviewed. No pertinent family history. ?Past Surgical History:  ?Procedure Laterality Date  ? CATARACT EXTRACTION Left   ? CERVICAL FUSION    ? COLONOSCOPY WITH PROPOFOL N/A 05/31/2016  ? Dr. Gala Romney: sigmoid and descending colon diverticulosis, normal colonic biopsies  ? ESOPHAGOGASTRODUODENOSCOPY (EGD) WITH PROPOFOL N/A 05/31/2016  ? Dr. Gala Romney: LA Grade A esophagitis, s/p dilation, erosive gastropathy, multiple benign gastric polyps, normal  duodenum  ? repair of rectal fissure    ? SUPERFICIAL KERATECTOMY Bilateral   ? ?Social History  ? ?Social History Narrative  ? Not on file  ? ?Immunization History  ?Administered Date(s) Administered  ? Moderna Sars-Covid-2 Vaccination 04/23/2019, 05/22/2019, 02/02/2020  ?   ? ?Objective: ?Vital Signs: BP 129/83 (BP Location: Left Arm, Patient Position: Sitting, Cuff Size: Normal)   Pulse 69   Ht '5\' 5"'$  (1.651 m)   Wt 117 lb 6.4 oz (53.3 kg)   BMI 19.54 kg/m?   ? ?Physical Exam ?Vitals and nursing note reviewed.  ?Constitutional:   ?   Appearance: She is well-developed.  ?HENT:  ?   Head: Normocephalic and atraumatic.  ?Eyes:  ?   Conjunctiva/sclera: Conjunctivae normal.  ?Cardiovascular:  ?   Rate and Rhythm: Normal rate and regular rhythm.  ?   Heart sounds: Normal heart sounds.  ?Pulmonary:  ?   Effort: Pulmonary effort is normal.  ?   Breath sounds: Normal breath sounds.  ?Abdominal:  ?   General: Bowel sounds are normal.  ?   Palpations: Abdomen is soft.  ?Musculoskeletal:  ?   Cervical back: Normal range of motion.  ?Lymphadenopathy:  ?   Cervical: No cervical adenopathy.  ?Skin: ?   General: Skin is warm and dry.  ?   Capillary Refill: Capillary refill takes less than 2 seconds.  ?Neurological:  ?   Mental Status: She is alert and oriented to person, place, and time.  ?Psychiatric:     ?   Behavior: Behavior normal.  ?  ? ?Musculoskeletal Exam: She had limited lateral rotation of the cervical spine.  She had discomfort range of motion of the lumbar spine.  Shoulder joints, elbow joints, wrist joints with good range of motion.  She had bilateral PIP and DIP thickening with subluxation of bilateral PIP and DIP joints.  Hip joints and knee joints in good range of motion.  She complains of discomfort in her knee joints.  No warmth swelling or effusion was noted.  She had tenderness over left trochanteric bursa.  There was no tenderness over ankles or MTPs. ? ?CDAI Exam: ?CDAI Score: -- ?Patient Global: --; Provider Global: -- ?Swollen: --; Tender: -- ?Joint Exam 07/26/2021  ? ?No joint exam has been documented for this visit  ? ?There is currently no information documented on the homunculus. Go to the Rheumatology activity and complete the homunculus joint  exam. ? ?Investigation: ?No additional findings. ? ?Imaging: ?No results found. ? ?Recent Labs: ?Lab Results  ?Component Value Date  ? WBC 4.9 06/18/2019  ? HGB 14.2 06/18/2019  ? PLT 205 06/18/2019  ? NA 139 06/25/2019  ? K 4.1 06/25/2019  ? CL 105 06/25/2019  ? CO2 26 06/25/2019  ? GLUCOSE 101 (H) 06/25/2019  ? BUN 17 06/25/2019  ? CREATININE 1.05 (H) 06/25/2019  ? BILITOT 1.5 (H) 06/18/2019  ? ALKPHOS 66 06/25/2016  ? AST 11 06/18/2019  ? ALT 14 06/18/2019  ? PROT 6.7 06/18/2019  ? ALBUMIN 4.1 06/25/2016  ? CALCIUM 9.4 06/25/2019  ? GFRAA 60 06/25/2019  ? ? ?Speciality Comments: No specialty comments available. ? ?Procedures:  ?No procedures performed ?Allergies: Cortisone  ? ?Assessment / Plan:     ?Visit Diagnoses: ANA positive - in the past.  Her ANA was negative later.  She also had AVISE labs in May 2022 which showed negative ANA and ENA was negative.  She denies any history of oral ulcers, nasal ulcers, malar  rash, photosensitivity, Raynaud's phenomenon or lymphadenopathy.  Advised her to contact me in case she develops any new symptoms. ? ?Primary osteoarthritis of both hands-she has severe osteoarthritis involving bilateral hands.  She has subluxation of PIP and DIP joints.  Joint protection muscle strengthening was emphasized.  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 ? ?Chronic pain of both shoulders - x-ray showed acromioclavicular arthritis in April 2021.  She continues to have discomfort in her shoulders.  I offered physical therapy but she declined. ? ?Primary osteoarthritis of both knees-she had mild osteoarthritis in her knee joints in 2020.  Patient states she fell about 2 years ago and since then she has been having increased pain in her knee joints.  She had recent x-rays at pain management.  She does not know the results yet.  She had follow-up with the pain management.  No warmth swelling or effusion was noted. ? ?Trochanteric bursitis of left hip-she had  tenderness over left trochanteric bursa.  IT band stretches were demonstrated.  Patient states she has handout at home and she will start a stretching exercises.  I discussed possible cortisone injection if her symptoms get worse. ?

## 2021-07-20 ENCOUNTER — Ambulatory Visit: Payer: Medicare Other | Admitting: Rheumatology

## 2021-07-25 DIAGNOSIS — G5602 Carpal tunnel syndrome, left upper limb: Secondary | ICD-10-CM | POA: Diagnosis not present

## 2021-07-25 DIAGNOSIS — G8929 Other chronic pain: Secondary | ICD-10-CM | POA: Diagnosis not present

## 2021-07-25 DIAGNOSIS — M25562 Pain in left knee: Secondary | ICD-10-CM | POA: Diagnosis not present

## 2021-07-25 DIAGNOSIS — M25561 Pain in right knee: Secondary | ICD-10-CM | POA: Diagnosis not present

## 2021-07-25 DIAGNOSIS — M1711 Unilateral primary osteoarthritis, right knee: Secondary | ICD-10-CM | POA: Diagnosis not present

## 2021-07-25 DIAGNOSIS — G894 Chronic pain syndrome: Secondary | ICD-10-CM | POA: Diagnosis not present

## 2021-07-26 ENCOUNTER — Ambulatory Visit (INDEPENDENT_AMBULATORY_CARE_PROVIDER_SITE_OTHER): Payer: Medicare Other | Admitting: Rheumatology

## 2021-07-26 ENCOUNTER — Encounter: Payer: Self-pay | Admitting: Rheumatology

## 2021-07-26 VITALS — BP 129/83 | HR 69 | Ht 65.0 in | Wt 117.4 lb

## 2021-07-26 DIAGNOSIS — G894 Chronic pain syndrome: Secondary | ICD-10-CM

## 2021-07-26 DIAGNOSIS — M8589 Other specified disorders of bone density and structure, multiple sites: Secondary | ICD-10-CM

## 2021-07-26 DIAGNOSIS — M170 Bilateral primary osteoarthritis of knee: Secondary | ICD-10-CM | POA: Diagnosis not present

## 2021-07-26 DIAGNOSIS — M7062 Trochanteric bursitis, left hip: Secondary | ICD-10-CM

## 2021-07-26 DIAGNOSIS — G8929 Other chronic pain: Secondary | ICD-10-CM

## 2021-07-26 DIAGNOSIS — M25511 Pain in right shoulder: Secondary | ICD-10-CM | POA: Diagnosis not present

## 2021-07-26 DIAGNOSIS — Z8673 Personal history of transient ischemic attack (TIA), and cerebral infarction without residual deficits: Secondary | ICD-10-CM | POA: Diagnosis not present

## 2021-07-26 DIAGNOSIS — R768 Other specified abnormal immunological findings in serum: Secondary | ICD-10-CM | POA: Diagnosis not present

## 2021-07-26 DIAGNOSIS — M5030 Other cervical disc degeneration, unspecified cervical region: Secondary | ICD-10-CM | POA: Diagnosis not present

## 2021-07-26 DIAGNOSIS — M5136 Other intervertebral disc degeneration, lumbar region: Secondary | ICD-10-CM | POA: Diagnosis not present

## 2021-07-26 DIAGNOSIS — M19041 Primary osteoarthritis, right hand: Secondary | ICD-10-CM

## 2021-07-26 DIAGNOSIS — M19042 Primary osteoarthritis, left hand: Secondary | ICD-10-CM

## 2021-07-26 DIAGNOSIS — Z7901 Long term (current) use of anticoagulants: Secondary | ICD-10-CM

## 2021-07-26 DIAGNOSIS — Z8719 Personal history of other diseases of the digestive system: Secondary | ICD-10-CM

## 2021-07-26 DIAGNOSIS — F5101 Primary insomnia: Secondary | ICD-10-CM

## 2021-07-26 DIAGNOSIS — M25512 Pain in left shoulder: Secondary | ICD-10-CM

## 2021-08-22 DIAGNOSIS — H02055 Trichiasis without entropian left lower eyelid: Secondary | ICD-10-CM | POA: Diagnosis not present

## 2021-08-22 DIAGNOSIS — H02052 Trichiasis without entropian right lower eyelid: Secondary | ICD-10-CM | POA: Diagnosis not present

## 2021-08-22 DIAGNOSIS — H02035 Senile entropion of left lower eyelid: Secondary | ICD-10-CM | POA: Diagnosis not present

## 2021-08-22 DIAGNOSIS — H02535 Eyelid retraction left lower eyelid: Secondary | ICD-10-CM | POA: Diagnosis not present

## 2021-08-22 DIAGNOSIS — H02042 Spastic entropion of right lower eyelid: Secondary | ICD-10-CM | POA: Diagnosis not present

## 2021-08-22 DIAGNOSIS — H02532 Eyelid retraction right lower eyelid: Secondary | ICD-10-CM | POA: Diagnosis not present

## 2021-08-22 DIAGNOSIS — H02032 Senile entropion of right lower eyelid: Secondary | ICD-10-CM | POA: Diagnosis not present

## 2021-08-22 DIAGNOSIS — H02045 Spastic entropion of left lower eyelid: Secondary | ICD-10-CM | POA: Diagnosis not present

## 2021-08-29 IMAGING — MG DIGITAL SCREENING BILAT W/ TOMO W/ CAD
8 series · 9 of 24 positions shown · non-contrast
Comparison: Previous exam(s).

CLINICAL DATA: Screening.

EXAM:
DIGITAL SCREENING BILATERAL MAMMOGRAM WITH TOMO AND CAD

[L MLO synth-2D]
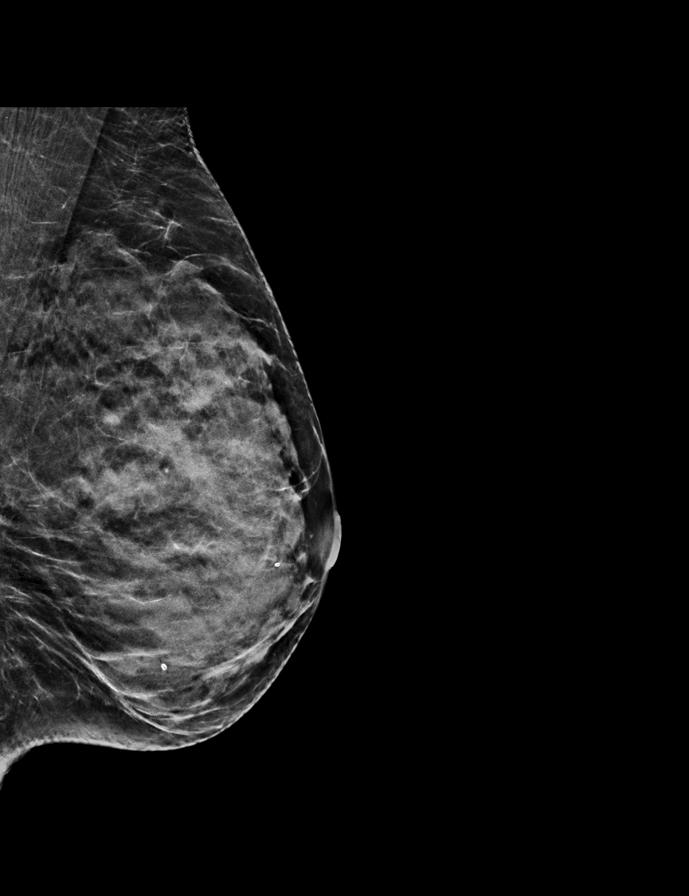

[L CC synth-2D]
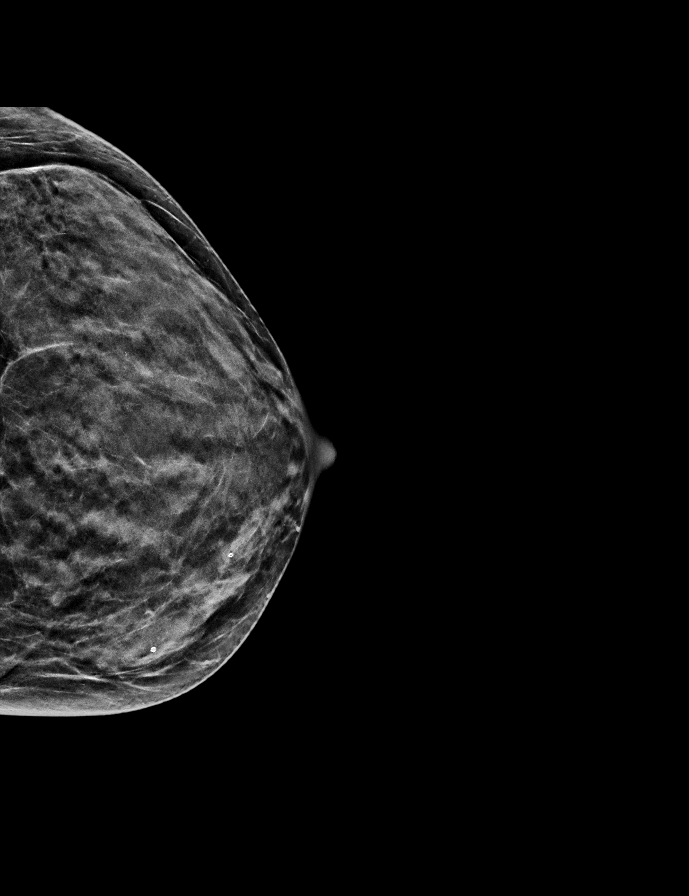

[R CC synth-2D]
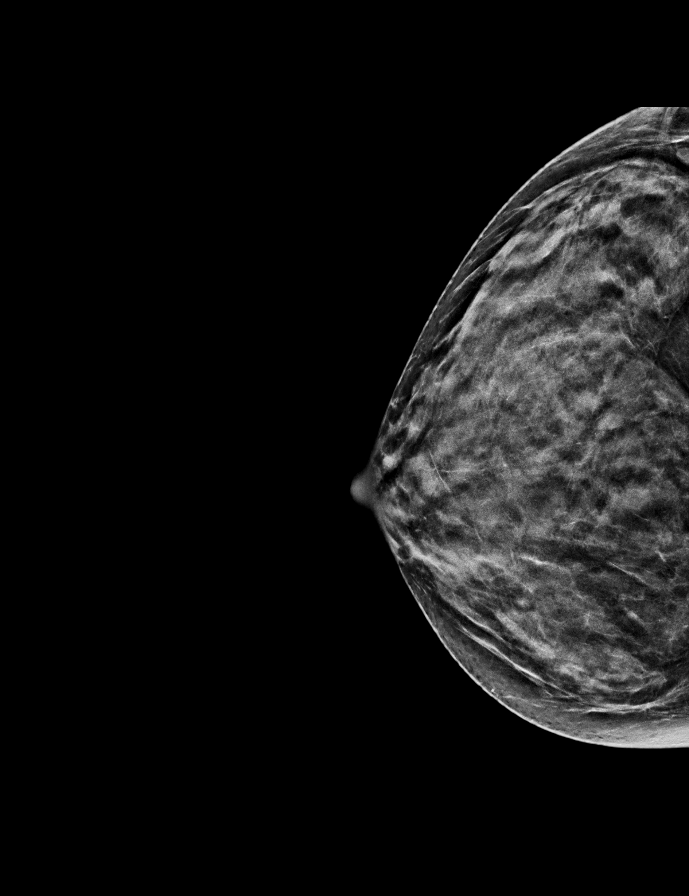

[R MLO synth-2D]
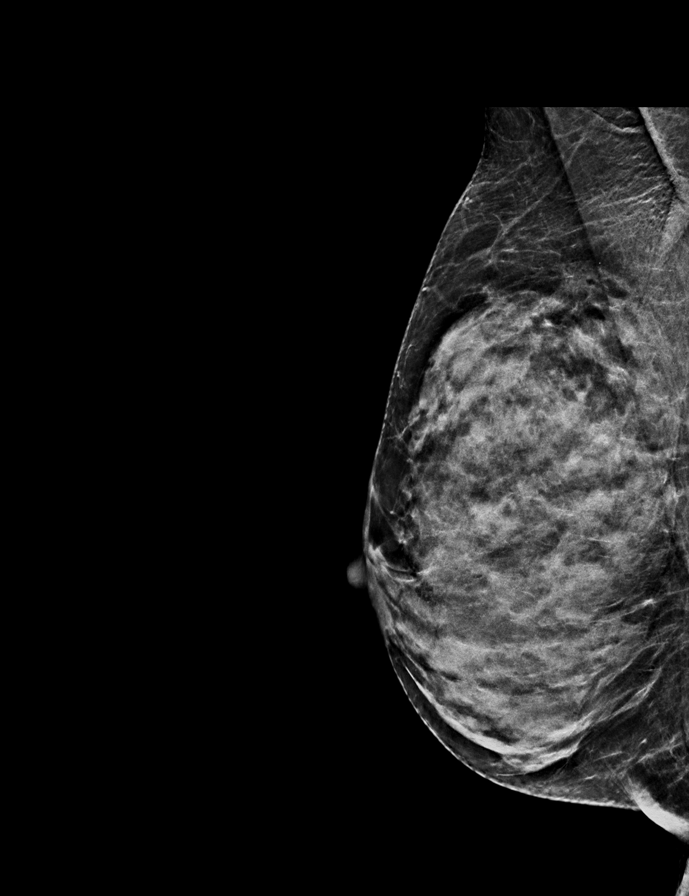

[L MLO tomo · 2 of 44 frames shown]
[frame 15/44]
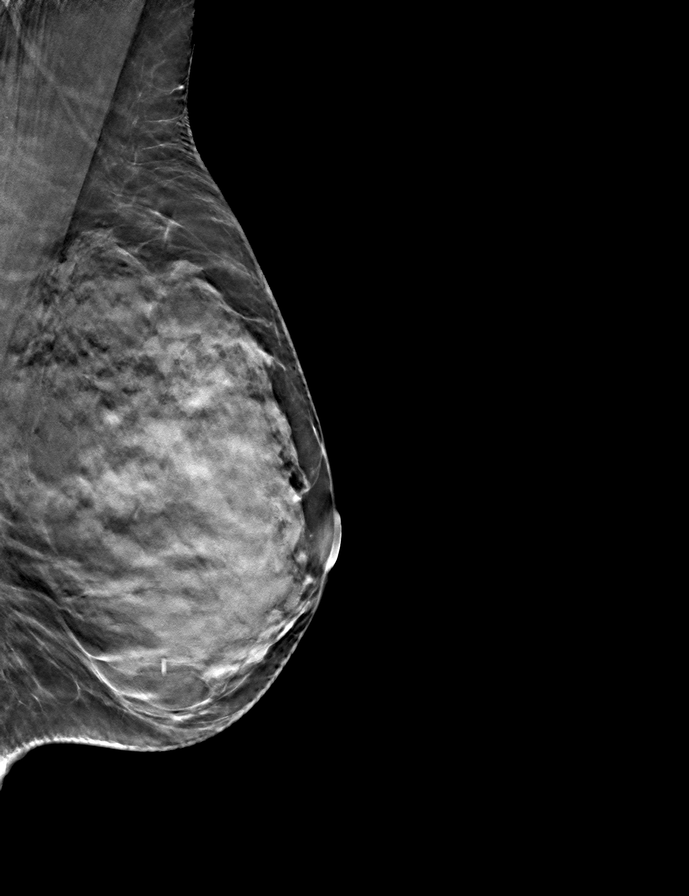
[frame 23/44]
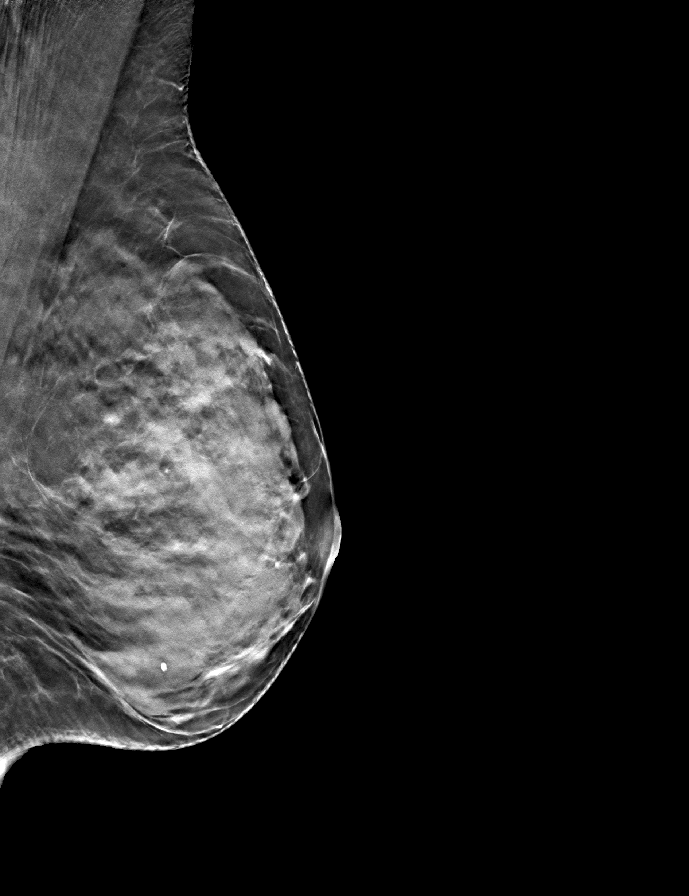

[R CC tomo · tomo slice 23/46.0]
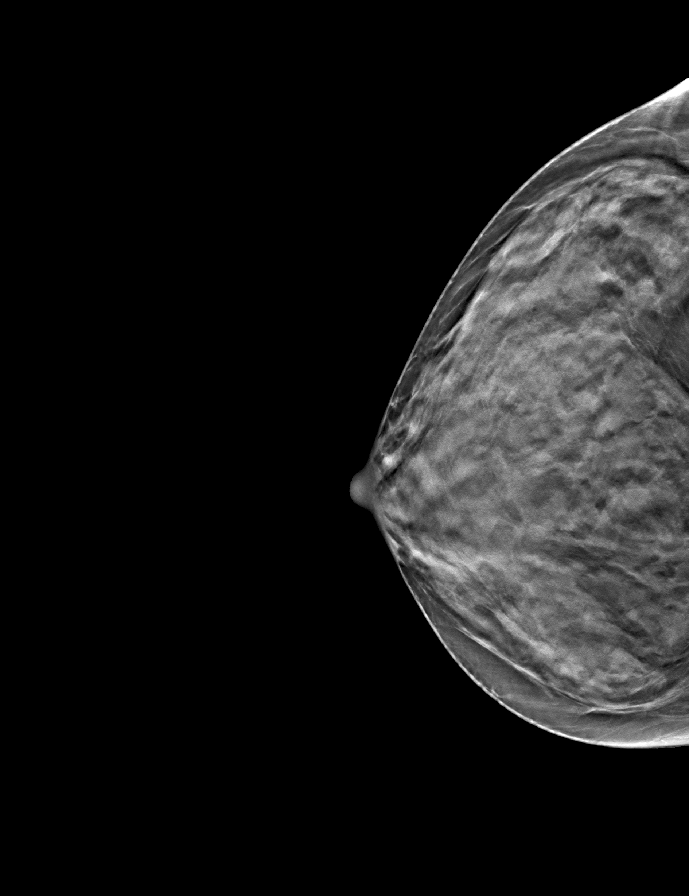

[L CC tomo · tomo slice 21/42.0]
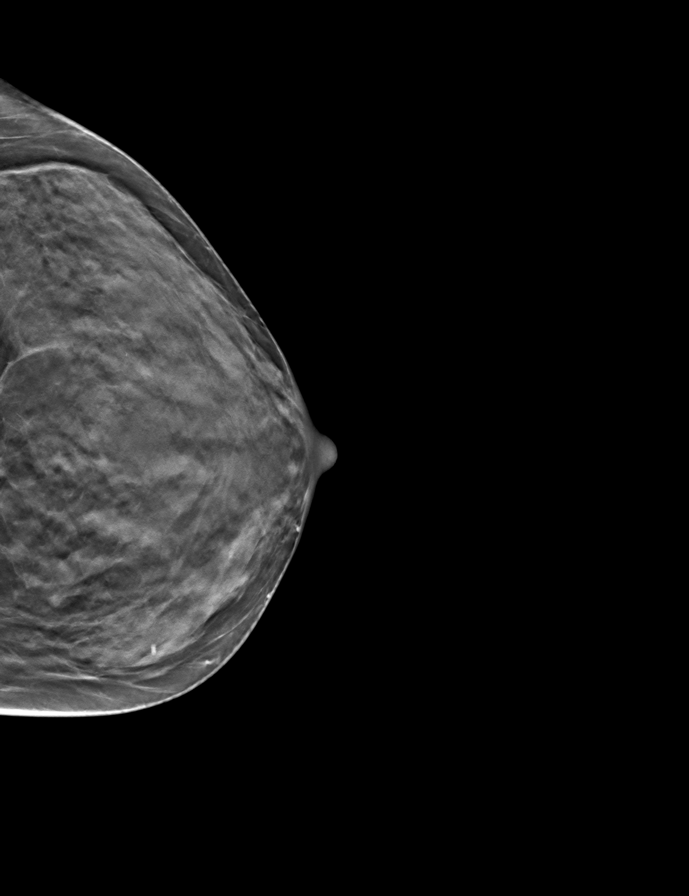

[R MLO tomo · tomo slice 25/48.0]
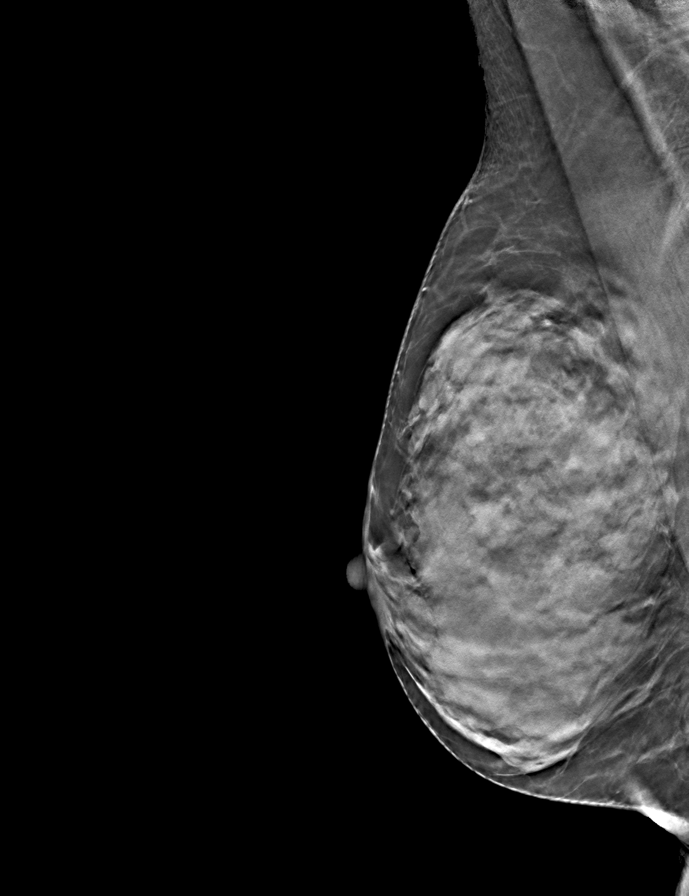

[9 of 24 positions shown; findings below may reference images not displayed]

ACR Breast Density Category d: The breast tissue is extremely dense,
which lowers the sensitivity of mammography
FINDINGS: There are no findings suspicious for malignancy. Images were
processed with CAD.
IMPRESSION: No mammographic evidence of malignancy. A result letter of this
screening mammogram will be mailed directly to the patient.

RECOMMENDATION:
Screening mammogram in one year. (Code:WO-0-ZI0)

BI-RADS CATEGORY  1: Negative.

## 2021-09-20 DIAGNOSIS — Z713 Dietary counseling and surveillance: Secondary | ICD-10-CM | POA: Diagnosis not present

## 2021-09-20 DIAGNOSIS — Z6820 Body mass index (BMI) 20.0-20.9, adult: Secondary | ICD-10-CM | POA: Diagnosis not present

## 2021-09-20 DIAGNOSIS — Z299 Encounter for prophylactic measures, unspecified: Secondary | ICD-10-CM | POA: Diagnosis not present

## 2021-09-20 DIAGNOSIS — I10 Essential (primary) hypertension: Secondary | ICD-10-CM | POA: Diagnosis not present

## 2021-09-25 DIAGNOSIS — G5602 Carpal tunnel syndrome, left upper limb: Secondary | ICD-10-CM | POA: Diagnosis not present

## 2021-09-25 DIAGNOSIS — G894 Chronic pain syndrome: Secondary | ICD-10-CM | POA: Diagnosis not present

## 2021-09-25 DIAGNOSIS — M5136 Other intervertebral disc degeneration, lumbar region: Secondary | ICD-10-CM | POA: Diagnosis not present

## 2021-09-25 DIAGNOSIS — M47816 Spondylosis without myelopathy or radiculopathy, lumbar region: Secondary | ICD-10-CM | POA: Diagnosis not present

## 2021-10-03 DIAGNOSIS — D239 Other benign neoplasm of skin, unspecified: Secondary | ICD-10-CM | POA: Diagnosis not present

## 2021-10-03 DIAGNOSIS — L0102 Bockhart's impetigo: Secondary | ICD-10-CM | POA: Diagnosis not present

## 2021-10-03 DIAGNOSIS — D485 Neoplasm of uncertain behavior of skin: Secondary | ICD-10-CM | POA: Diagnosis not present

## 2021-10-03 DIAGNOSIS — L814 Other melanin hyperpigmentation: Secondary | ICD-10-CM | POA: Diagnosis not present

## 2021-10-03 DIAGNOSIS — L821 Other seborrheic keratosis: Secondary | ICD-10-CM | POA: Diagnosis not present

## 2021-10-03 DIAGNOSIS — L920 Granuloma annulare: Secondary | ICD-10-CM | POA: Diagnosis not present

## 2021-10-30 ENCOUNTER — Other Ambulatory Visit (HOSPITAL_COMMUNITY): Payer: Self-pay | Admitting: Internal Medicine

## 2021-10-30 DIAGNOSIS — Z1231 Encounter for screening mammogram for malignant neoplasm of breast: Secondary | ICD-10-CM

## 2021-11-09 DIAGNOSIS — H353112 Nonexudative age-related macular degeneration, right eye, intermediate dry stage: Secondary | ICD-10-CM | POA: Diagnosis not present

## 2021-11-16 ENCOUNTER — Ambulatory Visit (HOSPITAL_COMMUNITY): Payer: Medicare Other

## 2021-11-21 DIAGNOSIS — G5602 Carpal tunnel syndrome, left upper limb: Secondary | ICD-10-CM | POA: Diagnosis not present

## 2021-11-29 ENCOUNTER — Ambulatory Visit (HOSPITAL_COMMUNITY)
Admission: RE | Admit: 2021-11-29 | Discharge: 2021-11-29 | Disposition: A | Discharge status: Home / Self Care | Payer: Medicare Other | Source: Ambulatory Visit | Attending: Internal Medicine | Admitting: Internal Medicine

## 2021-11-29 DIAGNOSIS — Z1231 Encounter for screening mammogram for malignant neoplasm of breast: Secondary | ICD-10-CM | POA: Diagnosis not present

## 2021-12-18 DIAGNOSIS — L570 Actinic keratosis: Secondary | ICD-10-CM | POA: Diagnosis not present

## 2021-12-26 DIAGNOSIS — R350 Frequency of micturition: Secondary | ICD-10-CM | POA: Diagnosis not present

## 2021-12-26 DIAGNOSIS — Z299 Encounter for prophylactic measures, unspecified: Secondary | ICD-10-CM | POA: Diagnosis not present

## 2021-12-26 DIAGNOSIS — Z79899 Other long term (current) drug therapy: Secondary | ICD-10-CM | POA: Diagnosis not present

## 2021-12-26 DIAGNOSIS — Z2821 Immunization not carried out because of patient refusal: Secondary | ICD-10-CM | POA: Diagnosis not present

## 2021-12-26 DIAGNOSIS — Z1339 Encounter for screening examination for other mental health and behavioral disorders: Secondary | ICD-10-CM | POA: Diagnosis not present

## 2021-12-26 DIAGNOSIS — Z1331 Encounter for screening for depression: Secondary | ICD-10-CM | POA: Diagnosis not present

## 2021-12-26 DIAGNOSIS — E559 Vitamin D deficiency, unspecified: Secondary | ICD-10-CM | POA: Diagnosis not present

## 2021-12-26 DIAGNOSIS — Z7189 Other specified counseling: Secondary | ICD-10-CM | POA: Diagnosis not present

## 2021-12-26 DIAGNOSIS — R5383 Other fatigue: Secondary | ICD-10-CM | POA: Diagnosis not present

## 2021-12-26 DIAGNOSIS — E7800 Pure hypercholesterolemia, unspecified: Secondary | ICD-10-CM | POA: Diagnosis not present

## 2021-12-26 DIAGNOSIS — Z6820 Body mass index (BMI) 20.0-20.9, adult: Secondary | ICD-10-CM | POA: Diagnosis not present

## 2021-12-26 DIAGNOSIS — Z0000 Encounter for general adult medical examination without abnormal findings: Secondary | ICD-10-CM | POA: Diagnosis not present

## 2022-01-03 DIAGNOSIS — G894 Chronic pain syndrome: Secondary | ICD-10-CM | POA: Diagnosis not present

## 2022-01-03 DIAGNOSIS — Z79891 Long term (current) use of opiate analgesic: Secondary | ICD-10-CM | POA: Diagnosis not present

## 2022-01-03 DIAGNOSIS — M47816 Spondylosis without myelopathy or radiculopathy, lumbar region: Secondary | ICD-10-CM | POA: Diagnosis not present

## 2022-01-03 DIAGNOSIS — M5136 Other intervertebral disc degeneration, lumbar region: Secondary | ICD-10-CM | POA: Diagnosis not present

## 2022-02-15 DIAGNOSIS — H353132 Nonexudative age-related macular degeneration, bilateral, intermediate dry stage: Secondary | ICD-10-CM | POA: Diagnosis not present

## 2022-03-28 DIAGNOSIS — Z789 Other specified health status: Secondary | ICD-10-CM | POA: Diagnosis not present

## 2022-03-28 DIAGNOSIS — Z681 Body mass index (BMI) 19 or less, adult: Secondary | ICD-10-CM | POA: Diagnosis not present

## 2022-03-28 DIAGNOSIS — N183 Chronic kidney disease, stage 3 unspecified: Secondary | ICD-10-CM | POA: Diagnosis not present

## 2022-03-28 DIAGNOSIS — I10 Essential (primary) hypertension: Secondary | ICD-10-CM | POA: Diagnosis not present

## 2022-03-28 DIAGNOSIS — Z299 Encounter for prophylactic measures, unspecified: Secondary | ICD-10-CM | POA: Diagnosis not present

## 2022-03-28 DIAGNOSIS — R350 Frequency of micturition: Secondary | ICD-10-CM | POA: Diagnosis not present

## 2022-03-28 DIAGNOSIS — N951 Menopausal and female climacteric states: Secondary | ICD-10-CM | POA: Diagnosis not present

## 2022-04-05 DIAGNOSIS — Z79891 Long term (current) use of opiate analgesic: Secondary | ICD-10-CM | POA: Diagnosis not present

## 2022-04-05 DIAGNOSIS — M47816 Spondylosis without myelopathy or radiculopathy, lumbar region: Secondary | ICD-10-CM | POA: Diagnosis not present

## 2022-04-05 DIAGNOSIS — G894 Chronic pain syndrome: Secondary | ICD-10-CM | POA: Diagnosis not present

## 2022-04-05 DIAGNOSIS — M5136 Other intervertebral disc degeneration, lumbar region: Secondary | ICD-10-CM | POA: Diagnosis not present

## 2022-04-19 DIAGNOSIS — H353132 Nonexudative age-related macular degeneration, bilateral, intermediate dry stage: Secondary | ICD-10-CM | POA: Diagnosis not present

## 2022-04-19 DIAGNOSIS — H43813 Vitreous degeneration, bilateral: Secondary | ICD-10-CM | POA: Diagnosis not present

## 2022-06-26 DIAGNOSIS — L570 Actinic keratosis: Secondary | ICD-10-CM | POA: Diagnosis not present

## 2022-06-27 DIAGNOSIS — Z681 Body mass index (BMI) 19 or less, adult: Secondary | ICD-10-CM | POA: Diagnosis not present

## 2022-06-27 DIAGNOSIS — I10 Essential (primary) hypertension: Secondary | ICD-10-CM | POA: Diagnosis not present

## 2022-06-27 DIAGNOSIS — N951 Menopausal and female climacteric states: Secondary | ICD-10-CM | POA: Diagnosis not present

## 2022-06-27 DIAGNOSIS — I69359 Hemiplegia and hemiparesis following cerebral infarction affecting unspecified side: Secondary | ICD-10-CM | POA: Diagnosis not present

## 2022-06-27 DIAGNOSIS — Z299 Encounter for prophylactic measures, unspecified: Secondary | ICD-10-CM | POA: Diagnosis not present

## 2022-07-04 DIAGNOSIS — G8929 Other chronic pain: Secondary | ICD-10-CM | POA: Diagnosis not present

## 2022-07-04 DIAGNOSIS — M47816 Spondylosis without myelopathy or radiculopathy, lumbar region: Secondary | ICD-10-CM | POA: Diagnosis not present

## 2022-07-04 DIAGNOSIS — M25561 Pain in right knee: Secondary | ICD-10-CM | POA: Diagnosis not present

## 2022-07-04 DIAGNOSIS — G894 Chronic pain syndrome: Secondary | ICD-10-CM | POA: Diagnosis not present

## 2022-07-04 DIAGNOSIS — Z1330 Encounter for screening examination for mental health and behavioral disorders, unspecified: Secondary | ICD-10-CM | POA: Diagnosis not present

## 2022-07-04 DIAGNOSIS — G5602 Carpal tunnel syndrome, left upper limb: Secondary | ICD-10-CM | POA: Diagnosis not present

## 2022-07-04 DIAGNOSIS — Z79891 Long term (current) use of opiate analgesic: Secondary | ICD-10-CM | POA: Diagnosis not present

## 2022-07-10 NOTE — Progress Notes (Signed)
Office Visit Note  Patient: Sheila Oliver             Date of Birth: [birth year]           MRN: [id number]             PCP: Kirstie Peri, MD Referring: Kirstie Peri, MD Visit Date: 07/24/2022 Occupation: @GUAROCC @  Subjective:  Pain in multiple joints  History of Present Illness: Sheila Oliver is a 82 y.o. female with history of osteoarthritis and degenerative disc disease.  She states she continues to have pain and discomfort in her bilateral hands.  She has noticed more deformities in her hands.  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  She also had recent problems with costochondritis which eased off.  She has some discomfort in her shoulders.  She has off-and-on discomfort in the left trochanteric region.  She has discomfort in her bilateral knee joints.  She states she had recent cortisone injection in the right knee joint which gave her some relief.  She goes for pain management and gets infrequent epidural injections.  She has been told that she has severe osteoarthritis in her knee joints and she is planning to see an orthopedic surgeon.  She also has bilateral carpal tunnel syndrome for which she gets injections by Dr. Charna Busman.  She denies any history of oral ulcers, nasal ulcers, malar rash, photosensitivity or Raynaud's phenomenon.    Activities of Daily Living:  Patient reports morning stiffness for all day. Patient Reports nocturnal pain.  Difficulty dressing/grooming: Denies Difficulty climbing stairs: Reports Difficulty getting out of chair: Denies Difficulty using hands for taps, buttons, cutlery, and/or writing: Reports  Review of Systems  Constitutional:  Positive for fatigue.  HENT:  Negative for mouth sores and mouth dryness.   Eyes:  Positive for dryness.  Respiratory:  Negative for difficulty breathing.   Cardiovascular:  Negative for chest pain and palpitations.  Gastrointestinal:  Negative for blood in stool, constipation  and diarrhea.  Endocrine: Negative for increased urination.  Genitourinary:  Negative for involuntary urination.  Musculoskeletal:  Positive for joint pain, joint pain, joint swelling and morning stiffness. Negative for gait problem, myalgias, muscle weakness, muscle tenderness and myalgias.  Skin:  Negative for color change, rash, hair loss and sensitivity to sunlight.  Allergic/Immunologic: Negative for susceptible to infections.  Neurological:  Negative for dizziness and headaches.  Hematological:  Negative for swollen glands.  Psychiatric/Behavioral:  Positive for sleep disturbance. Negative for depressed mood. The patient is not nervous/anxious.     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 difficile diarrhea    DDD (degenerative disc disease), lumbar    GERD (gastroesophageal reflux disease)    Macular degeneration    Osteoporosis    Scoliosis    Stroke (HCC) 04/19/2019    History reviewed. No pertinent family history. Past Surgical History:  Procedure Laterality Date   CATARACT EXTRACTION Left    CERVICAL FUSION  COLONOSCOPY WITH PROPOFOL N/A 05/31/2016   Dr. Jena Gauss: sigmoid and descending colon diverticulosis, normal colonic biopsies   ESOPHAGOGASTRODUODENOSCOPY (EGD) WITH PROPOFOL N/A 05/31/2016   Dr. Jena Gauss: LA Grade A esophagitis, s/p dilation, erosive gastropathy, multiple benign gastric polyps, normal duodenum   repair of rectal fissure     SUPERFICIAL KERATECTOMY  Bilateral    Social History   Social History Narrative   Not on file   Immunization History  Administered Date(s) Administered   Moderna Sars-Covid-2 Vaccination 04/23/2019, 05/22/2019, 02/02/2020     Objective: Vital Signs: BP 137/88 (BP Location: Left Arm, Patient Position: Sitting, Cuff Size: Normal)   Pulse 72   Resp 12   Ht 5\' 5"  (1.651 m)   Wt 113 lb 3.2 oz (51.3 kg)   BMI 18.84 kg/m    Physical Exam Vitals and nursing note reviewed.  Constitutional:      Appearance: She is well-developed.  HENT:     Head: Normocephalic and atraumatic.  Eyes:     Conjunctiva/sclera: Conjunctivae normal.  Cardiovascular:     Rate and Rhythm: Normal rate and regular rhythm.     Heart sounds: Normal heart sounds.  Pulmonary:     Effort: Pulmonary effort is normal.     Breath sounds: Normal breath sounds.  Abdominal:     General: Bowel sounds are normal.     Palpations: Abdomen is soft.  Musculoskeletal:     Cervical back: Normal range of motion.  Lymphadenopathy:     Cervical: No cervical adenopathy.  Skin:    General: Skin is warm and dry.     Capillary Refill: Capillary refill takes less than 2 seconds.  Neurological:     Mental Status: She is alert and oriented to person, place, and time.  Psychiatric:        Behavior: Behavior normal.      Musculoskeletal Exam: Lateral rotation of the cervical spine was limited.  She had discomfort range of motion of her lumbar spine without any point tenderness.  Shoulders, elbows, wrist joints were in good range of motion.  She had bilateral PIP DIP and CMC thickening with no synovitis.  Hip joints and knee joints in good range of motion.  She had tenderness on palpation over left trochanteric bursa.  Knee joints were in good range of motion without any warmth swelling or effusion.  There was no tenderness over ankles or MTPs.  CDAI Exam: CDAI Score: -- Patient Global: --; Provider Global: -- Swollen: --; Tender: -- Joint Exam  07/24/2022   No joint exam has been documented for this visit   There is currently no information documented on the homunculus. Go to the Rheumatology activity and complete the homunculus joint exam.  Investigation: No additional findings.  Imaging: No results found.  Recent Labs: Lab Results  Component Value Date   WBC 4.9 06/18/2019   HGB 14.2 06/18/2019   PLT 205 06/18/2019   NA 139 06/25/2019   K 4.1 06/25/2019   CL 105 06/25/2019   CO2 26 06/25/2019   GLUCOSE 101 (H) 06/25/2019   BUN 17 06/25/2019   CREATININE 1.05 (H) 06/25/2019   BILITOT 1.5 (H) 06/18/2019   ALKPHOS 66 06/25/2016   AST 11 06/18/2019   ALT 14 06/18/2019   PROT 6.7 06/18/2019   ALBUMIN 4.1 06/25/2016   CALCIUM 9.4 06/25/2019   GFRAA 60 06/25/2019    Speciality Comments: No specialty comments available.  Procedures:  No procedures performed Allergies: Cortisone   Assessment / Plan:  Visit Diagnoses: ANA positive - in the past.  Her ANA was negative later.  She also had [facility] labs in May 2022 which showed negative ANA and ENA was negative.  She denies any history of oral ulcers, nasal ulcers, malar rash, photosensitivity, lymphadenopathy or Raynaud's phenomenon.  No inflammatory arthritis was noted.  Primary osteoarthritis of both hands-she has severe osteoarthritis involving bilateral CMC PIP and DIP joints.  Joint protection muscle strengthening was discussed.  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  A handout on hand exercises was given.  Bilateral carpal tunnel syndrome-patient states that she has been diagnosed with bilateral carpal tunnel syndrome by Dr. Roderick Pee.  She has had cortisone injections which gave temporary relief.  Chronic pain of both shoulders -she had good range of motion today without much discomfort.  X-ray showed acromioclavicular arthritis in April 2021.  Primary osteoarthritis of both knees-she continues to have pain and  discomfort in her knee joints.  She states she had right knee joint cortisone injection recently which was helpful.  Trochanteric bursitis of left hip-she had tenderness over left trochanteric bursa.  Patient declined injection today.  A handout on IT band stretches was given.  DDD (degenerative disc disease), cervical-she had limited range of motion without much discomfort.  DDD (degenerative disc disease), lumbar -she has chronic pain and discomfort of lower back.  She is followed by Dr. Otho Darner.  Patient states that she gets infrequent injections.  Patient declined physical therapy.  Osteopenia of multiple sites - She has had DEXA with her PCP.  I do not have DEXA results available.  I advised her to get a repeat DEXA scan through Dr. Margaretmary Eddy office.  Chronic pain syndrome - followed by Dr.Otuel.  She is on tramadol.  History of stroke - she had a stroke in 2020.  She developed left-sided hemiparesis.  She continues to have weakness in the left lower extremity.  I offered physical therapy which she declined.  Primary insomnia  Chronic anticoagulation  History of gastroesophageal reflux (GERD)  Orders: No orders of the defined types were placed in this encounter.  No orders of the defined types were placed in this encounter.    Follow-Up Instructions: Return in about 1 year (around 07/24/2023) for Osteoarthritis.   Pollyann Savoy, MD  Note - This record has been created using Animal nutritionist.  Chart creation errors have been sought, but may not always  have been located. Such creation errors do not reflect on  the standard of medical care.

## 2022-07-24 ENCOUNTER — Ambulatory Visit: Payer: Medicare Other | Attending: Rheumatology | Admitting: Rheumatology

## 2022-07-24 ENCOUNTER — Encounter: Payer: Self-pay | Admitting: Rheumatology

## 2022-07-24 VITALS — BP 137/88 | HR 72 | Resp 12 | Ht 65.0 in | Wt 113.2 lb

## 2022-07-24 DIAGNOSIS — M5136 Other intervertebral disc degeneration, lumbar region: Secondary | ICD-10-CM | POA: Insufficient documentation

## 2022-07-24 DIAGNOSIS — M19041 Primary osteoarthritis, right hand: Secondary | ICD-10-CM

## 2022-07-24 DIAGNOSIS — G8929 Other chronic pain: Secondary | ICD-10-CM

## 2022-07-24 DIAGNOSIS — Z8673 Personal history of transient ischemic attack (TIA), and cerebral infarction without residual deficits: Secondary | ICD-10-CM | POA: Diagnosis not present

## 2022-07-24 DIAGNOSIS — R768 Other specified abnormal immunological findings in serum: Secondary | ICD-10-CM

## 2022-07-24 DIAGNOSIS — M51369 Other intervertebral disc degeneration, lumbar region without mention of lumbar back pain or lower extremity pain: Secondary | ICD-10-CM

## 2022-07-24 DIAGNOSIS — M19042 Primary osteoarthritis, left hand: Secondary | ICD-10-CM | POA: Diagnosis not present

## 2022-07-24 DIAGNOSIS — M170 Bilateral primary osteoarthritis of knee: Secondary | ICD-10-CM

## 2022-07-24 DIAGNOSIS — F5101 Primary insomnia: Secondary | ICD-10-CM

## 2022-07-24 DIAGNOSIS — M8589 Other specified disorders of bone density and structure, multiple sites: Secondary | ICD-10-CM | POA: Diagnosis not present

## 2022-07-24 DIAGNOSIS — M5030 Other cervical disc degeneration, unspecified cervical region: Secondary | ICD-10-CM | POA: Diagnosis not present

## 2022-07-24 DIAGNOSIS — G894 Chronic pain syndrome: Secondary | ICD-10-CM | POA: Diagnosis not present

## 2022-07-24 DIAGNOSIS — G5603 Carpal tunnel syndrome, bilateral upper limbs: Secondary | ICD-10-CM

## 2022-07-24 DIAGNOSIS — M25511 Pain in right shoulder: Secondary | ICD-10-CM | POA: Diagnosis not present

## 2022-07-24 DIAGNOSIS — Z7901 Long term (current) use of anticoagulants: Secondary | ICD-10-CM | POA: Diagnosis not present

## 2022-07-24 DIAGNOSIS — Z8719 Personal history of other diseases of the digestive system: Secondary | ICD-10-CM | POA: Diagnosis not present

## 2022-07-24 DIAGNOSIS — M25512 Pain in left shoulder: Secondary | ICD-10-CM | POA: Diagnosis not present

## 2022-07-24 DIAGNOSIS — M7062 Trochanteric bursitis, left hip: Secondary | ICD-10-CM | POA: Diagnosis not present

## 2022-07-24 NOTE — Patient Instructions (Signed)
Back Exercises The following exercises strengthen the muscles that help to support the trunk (torso) and back. They also help to keep the lower back flexible. Doing these exercises can help to prevent or lessen existing low back pain. If you have back pain or discomfort, try doing these exercises 2-3 times each day or as told by your health care provider. As your pain improves, do them once each day, but increase the number of times that you repeat the steps for each exercise (do more repetitions). To prevent the recurrence of back pain, continue to do these exercises once each day or as told by your health care provider. Do exercises exactly as told by your health care provider and adjust them as directed. It is normal to feel mild stretching, pulling, tightness, or discomfort as you do these exercises, but you should stop right away if you feel sudden pain or your pain gets worse. Exercises Single knee to chest Repeat these steps 3-5 times for each leg: Lie on your back on a firm bed or the floor with your legs extended. Bring one knee to your chest. Your other leg should stay extended and in contact with the floor. Hold your knee in place by grabbing your knee or thigh with both hands and hold. Pull on your knee until you feel a gentle stretch in your lower back or buttocks. Hold the stretch for 10-30 seconds. Slowly release and straighten your leg.  Pelvic tilt Repeat these steps 5-10 times: Lie on your back on a firm bed or the floor with your legs extended. Bend your knees so they are pointing toward the ceiling and your feet are flat on the floor. Tighten your lower abdominal muscles to press your lower back against the floor. This motion will tilt your pelvis so your tailbone points up toward the ceiling instead of pointing to your feet or the floor. With gentle tension and even breathing, hold this position for 5-10 seconds.  Cat-cow Repeat these steps until your lower back becomes  more flexible: Get into a hands-and-knees position on a firm bed or the floor. Keep your hands under your shoulders, and keep your knees under your hips. You may place padding under your knees for comfort. Let your head hang down toward your chest. Contract your abdominal muscles and point your tailbone toward the floor so your lower back becomes rounded like the back of a cat. Hold this position for 5 seconds. Slowly lift your head, let your abdominal muscles relax, and point your tailbone up toward the ceiling so your back forms a sagging arch like the back of a cow. Hold this position for 5 seconds.  Press-ups Repeat these steps 5-10 times: Lie on your abdomen (face-down) on a firm bed or the floor. Place your palms near your head, about shoulder-width apart. Keeping your back as relaxed as possible and keeping your hips on the floor, slowly straighten your arms to raise the top half of your body and lift your shoulders. Do not use your back muscles to raise your upper torso. You may adjust the placement of your hands to make yourself more comfortable. Hold this position for 5 seconds while you keep your back relaxed. Slowly return to lying flat on the floor.  Bridges Repeat these steps 10 times: Lie on your back on a firm bed or the floor. Bend your knees so they are pointing toward the ceiling and your feet are flat on the floor. Your arms should be flat   at your sides, next to your body. Tighten your buttocks muscles and lift your buttocks off the floor until your waist is at almost the same height as your knees. You should feel the muscles working in your buttocks and the back of your thighs. If you do not feel these muscles, slide your feet 1-2 inches (2.5-5 cm) farther away from your buttocks. Hold this position for 3-5 seconds. Slowly lower your hips to the starting position, and allow your buttocks muscles to relax completely. If this exercise is too easy, try doing it with your arms  crossed over your chest. Abdominal crunches Repeat these steps 5-10 times: Lie on your back on a firm bed or the floor with your legs extended. Bend your knees so they are pointing toward the ceiling and your feet are flat on the floor. Cross your arms over your chest. Tip your chin slightly toward your chest without bending your neck. Tighten your abdominal muscles and slowly raise your torso high enough to lift your shoulder blades a tiny bit off the floor. Avoid raising your torso higher than that because it can put too much stress on your lower back and does not help to strengthen your abdominal muscles. Slowly return to your starting position.  Back lifts Repeat these steps 5-10 times: Lie on your abdomen (face-down) with your arms at your sides, and rest your forehead on the floor. Tighten the muscles in your legs and your buttocks. Slowly lift your chest off the floor while you keep your hips pressed to the floor. Keep the back of your head in line with the curve in your back. Your eyes should be looking at the floor. Hold this position for 3-5 seconds. Slowly return to your starting position.  Contact a health care provider if: Your back pain or discomfort gets much worse when you do an exercise. Your worsening back pain or discomfort does not lessen within 2 hours after you exercise. If you have any of these problems, stop doing these exercises right away. Do not do them again unless your health care provider says that you can. Get help right away if: You develop sudden, severe back pain. If this happens, stop doing the exercises right away. Do not do them again unless your health care provider says that you can. This information is not intended to replace advice given to you by your health care provider. Make sure you discuss any questions you have with your health care provider. Document Revised: 08/30/2020 Document Reviewed: 05/18/2020 [employer] Patient Education  2023 [employer]  Hand Exercises Hand exercises can be helpful for almost anyone. These exercises can strengthen the hands, improve flexibility and movement, and increase blood flow to the hands. These results can make work and daily tasks easier. Hand exercises can be especially helpful for people who have joint pain from arthritis or have nerve damage from overuse (carpal tunnel syndrome). These exercises can also help people who have injured a hand. Exercises Most of these hand exercises are gentle stretching and motion exercises. It is usually safe to do them often throughout the day. Warming up your hands before exercise may help to reduce stiffness. You can do this with gentle massage or by placing your hands in warm water for 10-15 minutes. It is normal to feel some stretching, pulling, tightness, or mild discomfort as you begin new exercises. This will gradually improve. Stop an exercise right away if you feel sudden, severe pain or your pain gets worse. Ask   your health care provider which exercises are best for you. Knuckle bend or "claw" fist  Stand or sit with your arm, hand, and all five fingers pointed straight up. Make sure to keep your wrist straight during the exercise. Gently bend your fingers down toward your palm until the tips of your fingers are touching the top of your palm. Keep your big knuckle straight and just bend the small knuckles in your fingers. Hold this position for __________ seconds. Straighten (extend) your fingers back to the starting position. Repeat this exercise 5-10 times with each hand. Full finger fist  Stand or sit with your arm, hand, and all five fingers pointed straight up. Make sure to keep your wrist straight during the exercise. Gently bend your fingers into your palm until the tips of your fingers are touching the middle of your palm. Hold this position for __________ seconds. Extend your fingers back to the starting position, stretching every joint  fully. Repeat this exercise 5-10 times with each hand. Straight fist Stand or sit with your arm, hand, and all five fingers pointed straight up. Make sure to keep your wrist straight during the exercise. Gently bend your fingers at the big knuckle, where your fingers meet your hand, and the middle knuckle. Keep the knuckle at the tips of your fingers straight and try to touch the bottom of your palm. Hold this position for __________ seconds. Extend your fingers back to the starting position, stretching every joint fully. Repeat this exercise 5-10 times with each hand. Tabletop  Stand or sit with your arm, hand, and all five fingers pointed straight up. Make sure to keep your wrist straight during the exercise. Gently bend your fingers at the big knuckle, where your fingers meet your hand, as far down as you can while keeping the small knuckles in your fingers straight. Think of forming a tabletop with your fingers. Hold this position for __________ seconds. Extend your fingers back to the starting position, stretching every joint fully. Repeat this exercise 5-10 times with each hand. Finger spread  Place your hand flat on a table with your palm facing down. Make sure your wrist stays straight as you do this exercise. Spread your fingers and thumb apart from each other as far as you can until you feel a gentle stretch. Hold this position for __________ seconds. Bring your fingers and thumb tight together again. Hold this position for __________ seconds. Repeat this exercise 5-10 times with each hand. Making circles  Stand or sit with your arm, hand, and all five fingers pointed straight up. Make sure to keep your wrist straight during the exercise. Make a circle by touching the tip of your thumb to the tip of your index finger. Hold for __________ seconds. Then open your hand wide. Repeat this motion with your thumb and each finger on your hand. Repeat this exercise 5-10 times with each  hand. Thumb motion  Sit with your forearm resting on a table and your wrist straight. Your thumb should be facing up toward the ceiling. Keep your fingers relaxed as you move your thumb. Lift your thumb up as high as you can toward the ceiling. Hold for __________ seconds. Bend your thumb across your palm as far as you can, reaching the tip of your thumb for the small finger (pinkie) side of your palm. Hold for __________ seconds. Repeat this exercise 5-10 times with each hand. Grip strengthening  Hold a stress ball or other soft ball in the middle   of your hand. Slowly increase the pressure, squeezing the ball as much as you can without causing pain. Think of bringing the tips of your fingers into the middle of your palm. All of your finger joints should bend when doing this exercise. Hold your squeeze for __________ seconds, then relax. Repeat this exercise 5-10 times with each hand. Contact a health care provider if: Your hand pain or discomfort gets much worse when you do an exercise. Your hand pain or discomfort does not improve within 2 hours after you exercise. If you have any of these problems, stop doing these exercises right away. Do not do them again unless your health care provider says that you can. Get help right away if: You develop sudden, severe hand pain or swelling. If this happens, stop doing these exercises right away. Do not do them again unless your health care provider says that you can. This information is not intended to replace advice given to you by your health care provider. Make sure you discuss any questions you have with your health care provider. Document Revised: 06/16/2020 Document Reviewed: 06/23/2020 Elsevier Patient Education  2023 [employer].

## 2022-10-01 DIAGNOSIS — N183 Chronic kidney disease, stage 3 unspecified: Secondary | ICD-10-CM | POA: Diagnosis not present

## 2022-10-01 DIAGNOSIS — I69359 Hemiplegia and hemiparesis following cerebral infarction affecting unspecified side: Secondary | ICD-10-CM | POA: Diagnosis not present

## 2022-10-01 DIAGNOSIS — M329 Systemic lupus erythematosus, unspecified: Secondary | ICD-10-CM | POA: Diagnosis not present

## 2022-10-01 DIAGNOSIS — Z299 Encounter for prophylactic measures, unspecified: Secondary | ICD-10-CM | POA: Diagnosis not present

## 2022-10-01 DIAGNOSIS — K146 Glossodynia: Secondary | ICD-10-CM | POA: Diagnosis not present

## 2022-10-01 DIAGNOSIS — I10 Essential (primary) hypertension: Secondary | ICD-10-CM | POA: Diagnosis not present

## 2022-10-03 DIAGNOSIS — M47816 Spondylosis without myelopathy or radiculopathy, lumbar region: Secondary | ICD-10-CM | POA: Diagnosis not present

## 2022-10-03 DIAGNOSIS — Z79891 Long term (current) use of opiate analgesic: Secondary | ICD-10-CM | POA: Diagnosis not present

## 2022-10-03 DIAGNOSIS — M5136 Other intervertebral disc degeneration, lumbar region: Secondary | ICD-10-CM | POA: Diagnosis not present

## 2022-10-03 DIAGNOSIS — G894 Chronic pain syndrome: Secondary | ICD-10-CM | POA: Diagnosis not present

## 2022-11-30 ENCOUNTER — Other Ambulatory Visit (HOSPITAL_COMMUNITY): Payer: Self-pay | Admitting: Internal Medicine

## 2022-11-30 DIAGNOSIS — Z1231 Encounter for screening mammogram for malignant neoplasm of breast: Secondary | ICD-10-CM

## 2022-12-10 ENCOUNTER — Ambulatory Visit (HOSPITAL_COMMUNITY)
Admission: RE | Admit: 2022-12-10 | Discharge: 2022-12-10 | Disposition: A | Discharge status: Home / Self Care | Payer: Medicare Other | Source: Ambulatory Visit | Attending: Internal Medicine | Admitting: Internal Medicine

## 2022-12-10 ENCOUNTER — Encounter (HOSPITAL_COMMUNITY): Payer: Self-pay

## 2022-12-10 DIAGNOSIS — Z1231 Encounter for screening mammogram for malignant neoplasm of breast: Secondary | ICD-10-CM | POA: Diagnosis not present

## 2023-01-01 DIAGNOSIS — Z299 Encounter for prophylactic measures, unspecified: Secondary | ICD-10-CM | POA: Diagnosis not present

## 2023-01-01 DIAGNOSIS — E7800 Pure hypercholesterolemia, unspecified: Secondary | ICD-10-CM | POA: Diagnosis not present

## 2023-01-01 DIAGNOSIS — Z0000 Encounter for general adult medical examination without abnormal findings: Secondary | ICD-10-CM | POA: Diagnosis not present

## 2023-01-01 DIAGNOSIS — Z79899 Other long term (current) drug therapy: Secondary | ICD-10-CM | POA: Diagnosis not present

## 2023-01-01 DIAGNOSIS — Z2821 Immunization not carried out because of patient refusal: Secondary | ICD-10-CM | POA: Diagnosis not present

## 2023-01-01 DIAGNOSIS — I10 Essential (primary) hypertension: Secondary | ICD-10-CM | POA: Diagnosis not present

## 2023-01-01 DIAGNOSIS — Z1339 Encounter for screening examination for other mental health and behavioral disorders: Secondary | ICD-10-CM | POA: Diagnosis not present

## 2023-01-01 DIAGNOSIS — E559 Vitamin D deficiency, unspecified: Secondary | ICD-10-CM | POA: Diagnosis not present

## 2023-01-01 DIAGNOSIS — Z1331 Encounter for screening for depression: Secondary | ICD-10-CM | POA: Diagnosis not present

## 2023-01-01 DIAGNOSIS — R52 Pain, unspecified: Secondary | ICD-10-CM | POA: Diagnosis not present

## 2023-01-01 DIAGNOSIS — R5383 Other fatigue: Secondary | ICD-10-CM | POA: Diagnosis not present

## 2023-01-01 DIAGNOSIS — R350 Frequency of micturition: Secondary | ICD-10-CM | POA: Diagnosis not present

## 2023-01-01 DIAGNOSIS — Z7189 Other specified counseling: Secondary | ICD-10-CM | POA: Diagnosis not present

## 2023-01-03 DIAGNOSIS — M48061 Spinal stenosis, lumbar region without neurogenic claudication: Secondary | ICD-10-CM | POA: Diagnosis not present

## 2023-01-03 DIAGNOSIS — G5602 Carpal tunnel syndrome, left upper limb: Secondary | ICD-10-CM | POA: Diagnosis not present

## 2023-01-03 DIAGNOSIS — Z79891 Long term (current) use of opiate analgesic: Secondary | ICD-10-CM | POA: Diagnosis not present

## 2023-01-03 DIAGNOSIS — M47816 Spondylosis without myelopathy or radiculopathy, lumbar region: Secondary | ICD-10-CM | POA: Diagnosis not present

## 2023-01-03 DIAGNOSIS — G894 Chronic pain syndrome: Secondary | ICD-10-CM | POA: Diagnosis not present

## 2023-01-08 DIAGNOSIS — R634 Abnormal weight loss: Secondary | ICD-10-CM | POA: Diagnosis not present

## 2023-01-08 DIAGNOSIS — R053 Chronic cough: Secondary | ICD-10-CM | POA: Diagnosis not present

## 2023-01-08 DIAGNOSIS — K5730 Diverticulosis of large intestine without perforation or abscess without bleeding: Secondary | ICD-10-CM | POA: Diagnosis not present

## 2023-01-11 DIAGNOSIS — R634 Abnormal weight loss: Secondary | ICD-10-CM | POA: Diagnosis not present

## 2023-01-11 DIAGNOSIS — I10 Essential (primary) hypertension: Secondary | ICD-10-CM | POA: Diagnosis not present

## 2023-01-11 DIAGNOSIS — Z299 Encounter for prophylactic measures, unspecified: Secondary | ICD-10-CM | POA: Diagnosis not present

## 2023-01-11 DIAGNOSIS — R52 Pain, unspecified: Secondary | ICD-10-CM | POA: Diagnosis not present

## 2023-01-15 DIAGNOSIS — E2839 Other primary ovarian failure: Secondary | ICD-10-CM | POA: Diagnosis not present

## 2023-01-24 DIAGNOSIS — H353112 Nonexudative age-related macular degeneration, right eye, intermediate dry stage: Secondary | ICD-10-CM | POA: Diagnosis not present

## 2023-04-15 DIAGNOSIS — I10 Essential (primary) hypertension: Secondary | ICD-10-CM | POA: Diagnosis not present

## 2023-04-15 DIAGNOSIS — R52 Pain, unspecified: Secondary | ICD-10-CM | POA: Diagnosis not present

## 2023-04-15 DIAGNOSIS — E440 Moderate protein-calorie malnutrition: Secondary | ICD-10-CM | POA: Diagnosis not present

## 2023-04-15 DIAGNOSIS — I69359 Hemiplegia and hemiparesis following cerebral infarction affecting unspecified side: Secondary | ICD-10-CM | POA: Diagnosis not present

## 2023-04-15 DIAGNOSIS — M329 Systemic lupus erythematosus, unspecified: Secondary | ICD-10-CM | POA: Diagnosis not present

## 2023-04-15 DIAGNOSIS — Z299 Encounter for prophylactic measures, unspecified: Secondary | ICD-10-CM | POA: Diagnosis not present

## 2023-04-15 DIAGNOSIS — N183 Chronic kidney disease, stage 3 unspecified: Secondary | ICD-10-CM | POA: Diagnosis not present

## 2023-04-24 DIAGNOSIS — M47816 Spondylosis without myelopathy or radiculopathy, lumbar region: Secondary | ICD-10-CM | POA: Diagnosis not present

## 2023-04-24 DIAGNOSIS — G894 Chronic pain syndrome: Secondary | ICD-10-CM | POA: Diagnosis not present

## 2023-04-24 DIAGNOSIS — Z5181 Encounter for therapeutic drug level monitoring: Secondary | ICD-10-CM | POA: Diagnosis not present

## 2023-04-24 DIAGNOSIS — Z79891 Long term (current) use of opiate analgesic: Secondary | ICD-10-CM | POA: Diagnosis not present

## 2023-04-24 DIAGNOSIS — G8929 Other chronic pain: Secondary | ICD-10-CM | POA: Diagnosis not present

## 2023-04-24 DIAGNOSIS — M25562 Pain in left knee: Secondary | ICD-10-CM | POA: Diagnosis not present

## 2023-04-24 DIAGNOSIS — M25561 Pain in right knee: Secondary | ICD-10-CM | POA: Diagnosis not present

## 2023-04-24 DIAGNOSIS — G5602 Carpal tunnel syndrome, left upper limb: Secondary | ICD-10-CM | POA: Diagnosis not present

## 2023-05-13 DIAGNOSIS — R634 Abnormal weight loss: Secondary | ICD-10-CM | POA: Diagnosis not present

## 2023-05-13 DIAGNOSIS — R52 Pain, unspecified: Secondary | ICD-10-CM | POA: Diagnosis not present

## 2023-05-13 DIAGNOSIS — Z299 Encounter for prophylactic measures, unspecified: Secondary | ICD-10-CM | POA: Diagnosis not present

## 2023-05-13 DIAGNOSIS — I10 Essential (primary) hypertension: Secondary | ICD-10-CM | POA: Diagnosis not present

## 2023-05-13 DIAGNOSIS — N183 Chronic kidney disease, stage 3 unspecified: Secondary | ICD-10-CM | POA: Diagnosis not present

## 2023-05-13 DIAGNOSIS — M329 Systemic lupus erythematosus, unspecified: Secondary | ICD-10-CM | POA: Diagnosis not present

## 2023-05-13 DIAGNOSIS — D692 Other nonthrombocytopenic purpura: Secondary | ICD-10-CM | POA: Diagnosis not present

## 2023-06-20 DIAGNOSIS — H353132 Nonexudative age-related macular degeneration, bilateral, intermediate dry stage: Secondary | ICD-10-CM | POA: Diagnosis not present

## 2023-06-20 DIAGNOSIS — H43813 Vitreous degeneration, bilateral: Secondary | ICD-10-CM | POA: Diagnosis not present

## 2023-07-12 NOTE — Progress Notes (Deleted)
 Office Visit Note  Patient: Sheila Oliver             Date of Birth: 1941/01/25           MRN: [id number]             PCP: Theoplis Fix, MD Referring: Theoplis Fix, MD Visit Date: 07/24/2023 Occupation: @GUAROCC @  Subjective:  No chief complaint on file.   History of Present Illness: Sheila Oliver is a 83 y.o. female ***     Activities of Daily Living:  Patient reports morning stiffness for *** {minute/hour:19697}.   Patient {ACTIONS;DENIES/REPORTS:21021675::"Denies"} nocturnal pain.  Difficulty dressing/grooming: {ACTIONS;DENIES/REPORTS:21021675::"Denies"} Difficulty climbing stairs: {ACTIONS;DENIES/REPORTS:21021675::"Denies"} Difficulty getting out of chair: {ACTIONS;DENIES/REPORTS:21021675::"Denies"} Difficulty using hands for taps, buttons, cutlery, and/or writing: {ACTIONS;DENIES/REPORTS:21021675::"Denies"}  No Rheumatology ROS completed.   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 difficile diarrhea    DDD (degenerative disc disease), lumbar    GERD (gastroesophageal reflux disease)    Macular degeneration    Osteoporosis    Scoliosis    Stroke (HCC) 04/19/2019    No family history on file. Past Surgical History:  Procedure Laterality Date   CATARACT EXTRACTION Left    CERVICAL FUSION     COLONOSCOPY WITH PROPOFOL  N/A 05/31/2016   Dr. Riley Cheadle: sigmoid and descending colon diverticulosis, normal colonic biopsies   ESOPHAGOGASTRODUODENOSCOPY (EGD) WITH  PROPOFOL  N/A 05/31/2016   Dr. Riley Cheadle: LA Grade A esophagitis, s/p dilation, erosive gastropathy, multiple benign gastric polyps, normal duodenum   repair of rectal fissure     SUPERFICIAL KERATECTOMY Bilateral    Social History   Social History Narrative   Not on file   Immunization History  Administered Date(s) Administered   Moderna Sars-Covid-2 Vaccination 04/23/2019, 05/22/2019, 02/02/2020     Objective: Vital Signs: There were no vitals taken for this visit.   Physical Exam   Musculoskeletal Exam: ***  CDAI Exam: CDAI Score: -- Patient Global: --; Provider Global: -- Swollen: --; Tender: -- Joint Exam 07/24/2023   No joint exam has been documented for this visit   There is currently no information documented on the homunculus. Go to the Rheumatology activity and complete the homunculus joint exam.  Investigation: No additional findings.  Imaging: No results found.  Recent Labs: Lab Results  Component Value Date   WBC 4.9 06/18/2019   HGB 14.2 06/18/2019   PLT 205 06/18/2019   NA 139 06/25/2019   K 4.1 06/25/2019   CL 105 06/25/2019   CO2 26 06/25/2019   GLUCOSE 101 (H) 06/25/2019   BUN 17 06/25/2019   CREATININE 1.05 (H) 06/25/2019   BILITOT 1.5 (H) 06/18/2019   ALKPHOS 66 06/25/2016   AST 11 06/18/2019   ALT 14 06/18/2019   PROT 6.7 06/18/2019   ALBUMIN 4.1 06/25/2016   CALCIUM 9.4 06/25/2019   GFRAA 60 06/25/2019    Speciality Comments: No specialty comments available.  Procedures:  No procedures performed Allergies: Cortisone   Assessment / Plan:     Visit Diagnoses: No  diagnosis found.  Orders: No orders of the defined types were placed in this encounter.  No orders of the defined types were placed in this encounter.   Face-to-face time spent with patient was *** minutes. Greater than 50% of time was spent in counseling and coordination of care.  Follow-Up Instructions: No follow-ups on file.   Dee Farber, CMA  Note -  This record has been created using Animal nutritionist.  Chart creation errors have been sought, but may not always  have been located. Such creation errors do not reflect on  the standard of medical care.

## 2023-07-23 DIAGNOSIS — G894 Chronic pain syndrome: Secondary | ICD-10-CM | POA: Diagnosis not present

## 2023-07-23 DIAGNOSIS — Z5181 Encounter for therapeutic drug level monitoring: Secondary | ICD-10-CM | POA: Diagnosis not present

## 2023-07-23 DIAGNOSIS — Z79891 Long term (current) use of opiate analgesic: Secondary | ICD-10-CM | POA: Diagnosis not present

## 2023-07-23 DIAGNOSIS — M47816 Spondylosis without myelopathy or radiculopathy, lumbar region: Secondary | ICD-10-CM | POA: Diagnosis not present

## 2023-07-23 DIAGNOSIS — G5602 Carpal tunnel syndrome, left upper limb: Secondary | ICD-10-CM | POA: Diagnosis not present

## 2023-07-24 ENCOUNTER — Ambulatory Visit: Payer: Medicare Other | Admitting: Rheumatology

## 2023-07-24 DIAGNOSIS — F5101 Primary insomnia: Secondary | ICD-10-CM

## 2023-07-24 DIAGNOSIS — M5030 Other cervical disc degeneration, unspecified cervical region: Secondary | ICD-10-CM

## 2023-07-24 DIAGNOSIS — R768 Other specified abnormal immunological findings in serum: Secondary | ICD-10-CM

## 2023-07-24 DIAGNOSIS — M8589 Other specified disorders of bone density and structure, multiple sites: Secondary | ICD-10-CM

## 2023-07-24 DIAGNOSIS — M47816 Spondylosis without myelopathy or radiculopathy, lumbar region: Secondary | ICD-10-CM

## 2023-07-24 DIAGNOSIS — G8929 Other chronic pain: Secondary | ICD-10-CM

## 2023-07-24 DIAGNOSIS — G894 Chronic pain syndrome: Secondary | ICD-10-CM

## 2023-07-24 DIAGNOSIS — M19041 Primary osteoarthritis, right hand: Secondary | ICD-10-CM

## 2023-07-24 DIAGNOSIS — Z8673 Personal history of transient ischemic attack (TIA), and cerebral infarction without residual deficits: Secondary | ICD-10-CM

## 2023-07-24 DIAGNOSIS — M7062 Trochanteric bursitis, left hip: Secondary | ICD-10-CM

## 2023-07-24 DIAGNOSIS — Z7901 Long term (current) use of anticoagulants: Secondary | ICD-10-CM

## 2023-07-24 DIAGNOSIS — Z8719 Personal history of other diseases of the digestive system: Secondary | ICD-10-CM

## 2023-07-24 DIAGNOSIS — G5603 Carpal tunnel syndrome, bilateral upper limbs: Secondary | ICD-10-CM

## 2023-07-24 DIAGNOSIS — M170 Bilateral primary osteoarthritis of knee: Secondary | ICD-10-CM

## 2023-09-24 DIAGNOSIS — R0981 Nasal congestion: Secondary | ICD-10-CM | POA: Diagnosis not present

## 2023-09-24 DIAGNOSIS — I69359 Hemiplegia and hemiparesis following cerebral infarction affecting unspecified side: Secondary | ICD-10-CM | POA: Diagnosis not present

## 2023-09-24 DIAGNOSIS — N183 Chronic kidney disease, stage 3 unspecified: Secondary | ICD-10-CM | POA: Diagnosis not present

## 2023-09-24 DIAGNOSIS — M329 Systemic lupus erythematosus, unspecified: Secondary | ICD-10-CM | POA: Diagnosis not present

## 2023-09-24 DIAGNOSIS — Z299 Encounter for prophylactic measures, unspecified: Secondary | ICD-10-CM | POA: Diagnosis not present

## 2023-09-24 DIAGNOSIS — J329 Chronic sinusitis, unspecified: Secondary | ICD-10-CM | POA: Diagnosis not present

## 2023-10-14 DIAGNOSIS — M51369 Other intervertebral disc degeneration, lumbar region without mention of lumbar back pain or lower extremity pain: Secondary | ICD-10-CM | POA: Diagnosis not present

## 2023-10-24 DIAGNOSIS — M51362 Other intervertebral disc degeneration, lumbar region with discogenic back pain and lower extremity pain: Secondary | ICD-10-CM | POA: Diagnosis not present

## 2023-10-24 DIAGNOSIS — M48061 Spinal stenosis, lumbar region without neurogenic claudication: Secondary | ICD-10-CM | POA: Diagnosis not present

## 2023-10-24 DIAGNOSIS — M4726 Other spondylosis with radiculopathy, lumbar region: Secondary | ICD-10-CM | POA: Diagnosis not present

## 2023-10-24 DIAGNOSIS — G894 Chronic pain syndrome: Secondary | ICD-10-CM | POA: Diagnosis not present

## 2023-11-11 DIAGNOSIS — R634 Abnormal weight loss: Secondary | ICD-10-CM | POA: Diagnosis not present

## 2023-11-11 DIAGNOSIS — Z299 Encounter for prophylactic measures, unspecified: Secondary | ICD-10-CM | POA: Diagnosis not present

## 2023-11-11 DIAGNOSIS — R350 Frequency of micturition: Secondary | ICD-10-CM | POA: Diagnosis not present

## 2023-11-11 DIAGNOSIS — R32 Unspecified urinary incontinence: Secondary | ICD-10-CM | POA: Diagnosis not present

## 2023-11-11 DIAGNOSIS — I10 Essential (primary) hypertension: Secondary | ICD-10-CM | POA: Diagnosis not present

## 2023-11-11 DIAGNOSIS — N1832 Chronic kidney disease, stage 3b: Secondary | ICD-10-CM | POA: Diagnosis not present

## 2023-11-29 ENCOUNTER — Other Ambulatory Visit (HOSPITAL_COMMUNITY): Payer: Self-pay | Admitting: Internal Medicine

## 2023-11-29 ENCOUNTER — Encounter (HOSPITAL_COMMUNITY): Payer: Self-pay | Admitting: Internal Medicine

## 2023-11-29 DIAGNOSIS — Z1231 Encounter for screening mammogram for malignant neoplasm of breast: Secondary | ICD-10-CM

## 2023-11-29 DIAGNOSIS — Z1239 Encounter for other screening for malignant neoplasm of breast: Secondary | ICD-10-CM

## 2023-12-16 ENCOUNTER — Ambulatory Visit (HOSPITAL_COMMUNITY)

## 2023-12-18 ENCOUNTER — Encounter (HOSPITAL_COMMUNITY): Payer: Self-pay

## 2023-12-18 ENCOUNTER — Ambulatory Visit (HOSPITAL_COMMUNITY)
Admission: RE | Admit: 2023-12-18 | Discharge: 2023-12-18 | Disposition: A | Discharge status: Home / Self Care | Source: Ambulatory Visit | Attending: Internal Medicine | Admitting: Internal Medicine

## 2023-12-18 DIAGNOSIS — M545 Low back pain, unspecified: Secondary | ICD-10-CM | POA: Diagnosis not present

## 2023-12-18 DIAGNOSIS — E7800 Pure hypercholesterolemia, unspecified: Secondary | ICD-10-CM | POA: Diagnosis not present

## 2023-12-18 DIAGNOSIS — Z1231 Encounter for screening mammogram for malignant neoplasm of breast: Secondary | ICD-10-CM | POA: Insufficient documentation

## 2023-12-18 DIAGNOSIS — Z0000 Encounter for general adult medical examination without abnormal findings: Secondary | ICD-10-CM | POA: Diagnosis not present

## 2023-12-18 DIAGNOSIS — Z7189 Other specified counseling: Secondary | ICD-10-CM | POA: Diagnosis not present

## 2023-12-18 DIAGNOSIS — Z79899 Other long term (current) drug therapy: Secondary | ICD-10-CM | POA: Diagnosis not present

## 2023-12-18 DIAGNOSIS — R5383 Other fatigue: Secondary | ICD-10-CM | POA: Diagnosis not present

## 2023-12-18 DIAGNOSIS — R52 Pain, unspecified: Secondary | ICD-10-CM | POA: Diagnosis not present

## 2023-12-18 DIAGNOSIS — Z1339 Encounter for screening examination for other mental health and behavioral disorders: Secondary | ICD-10-CM | POA: Diagnosis not present

## 2023-12-18 DIAGNOSIS — Z1331 Encounter for screening for depression: Secondary | ICD-10-CM | POA: Diagnosis not present

## 2023-12-18 DIAGNOSIS — Z299 Encounter for prophylactic measures, unspecified: Secondary | ICD-10-CM | POA: Diagnosis not present

## 2023-12-18 DIAGNOSIS — I10 Essential (primary) hypertension: Secondary | ICD-10-CM | POA: Diagnosis not present

## 2023-12-18 DIAGNOSIS — E559 Vitamin D deficiency, unspecified: Secondary | ICD-10-CM | POA: Diagnosis not present

## 2023-12-23 ENCOUNTER — Ambulatory Visit (HOSPITAL_COMMUNITY)

## 2024-01-20 DIAGNOSIS — Z79891 Long term (current) use of opiate analgesic: Secondary | ICD-10-CM | POA: Diagnosis not present

## 2024-01-20 DIAGNOSIS — M5136 Other intervertebral disc degeneration, lumbar region with discogenic back pain only: Secondary | ICD-10-CM | POA: Diagnosis not present

## 2024-01-20 DIAGNOSIS — M48061 Spinal stenosis, lumbar region without neurogenic claudication: Secondary | ICD-10-CM | POA: Diagnosis not present

## 2024-01-20 DIAGNOSIS — M47816 Spondylosis without myelopathy or radiculopathy, lumbar region: Secondary | ICD-10-CM | POA: Diagnosis not present

## 2024-01-20 DIAGNOSIS — G894 Chronic pain syndrome: Secondary | ICD-10-CM | POA: Diagnosis not present

## 2024-01-20 DIAGNOSIS — M461 Sacroiliitis, not elsewhere classified: Secondary | ICD-10-CM | POA: Diagnosis not present

## 2024-01-27 DIAGNOSIS — H35362 Drusen (degenerative) of macula, left eye: Secondary | ICD-10-CM | POA: Diagnosis not present

## 2024-02-18 DIAGNOSIS — I10 Essential (primary) hypertension: Secondary | ICD-10-CM | POA: Diagnosis not present

## 2024-02-18 DIAGNOSIS — E7800 Pure hypercholesterolemia, unspecified: Secondary | ICD-10-CM | POA: Diagnosis not present

## 2024-02-18 DIAGNOSIS — Z299 Encounter for prophylactic measures, unspecified: Secondary | ICD-10-CM | POA: Diagnosis not present

## 2024-02-18 DIAGNOSIS — R52 Pain, unspecified: Secondary | ICD-10-CM | POA: Diagnosis not present

## 2024-02-18 DIAGNOSIS — I69359 Hemiplegia and hemiparesis following cerebral infarction affecting unspecified side: Secondary | ICD-10-CM | POA: Diagnosis not present

## 2024-02-18 DIAGNOSIS — M545 Low back pain, unspecified: Secondary | ICD-10-CM | POA: Diagnosis not present
